# Patient Record
Sex: Male | Born: 1985 | Race: White | Hispanic: No | Marital: Married | Smoking: Never smoker
Health system: Southern US, Community
[De-identification: ages and names within clinical notes are randomized; demographics above are authoritative.]

## PROBLEM LIST (undated history)

## (undated) DIAGNOSIS — K219 Gastro-esophageal reflux disease without esophagitis: Secondary | ICD-10-CM

## (undated) DIAGNOSIS — M7150 Other bursitis, not elsewhere classified, unspecified site: Secondary | ICD-10-CM

## (undated) DIAGNOSIS — Q798 Other congenital malformations of musculoskeletal system: Secondary | ICD-10-CM

## (undated) DIAGNOSIS — G43909 Migraine, unspecified, not intractable, without status migrainosus: Secondary | ICD-10-CM

## (undated) HISTORY — DX: Other congenital malformations of musculoskeletal system: Q79.8

## (undated) HISTORY — DX: Migraine, unspecified, not intractable, without status migrainosus: G43.909

## (undated) HISTORY — DX: Gastro-esophageal reflux disease without esophagitis: K21.9

## (undated) HISTORY — DX: Other bursitis, not elsewhere classified, unspecified site: M71.50

---
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
## 2007-04-16 HISTORY — PX: SHOULDER SURGERY: SHX246

## 2008-04-15 HISTORY — PX: TOE SURGERY: SHX1073

## 2013-11-26 ENCOUNTER — Encounter: Payer: Self-pay | Admitting: *Deleted

## 2013-11-29 ENCOUNTER — Encounter: Payer: Self-pay | Admitting: Neurology

## 2013-11-29 ENCOUNTER — Ambulatory Visit (INDEPENDENT_AMBULATORY_CARE_PROVIDER_SITE_OTHER): Payer: PRIVATE HEALTH INSURANCE | Admitting: Neurology

## 2013-11-29 VITALS — BP 116/66 | HR 78 | Ht 73.0 in | Wt 172.0 lb

## 2013-11-29 DIAGNOSIS — G5622 Lesion of ulnar nerve, left upper limb: Secondary | ICD-10-CM

## 2013-11-29 DIAGNOSIS — G5620 Lesion of ulnar nerve, unspecified upper limb: Secondary | ICD-10-CM

## 2013-11-29 HISTORY — DX: Lesion of ulnar nerve, left upper limb: G56.22

## 2013-11-29 NOTE — Patient Instructions (Signed)
Overall you are doing fairly well but I do want to suggest a few things today:   Remember to drink plenty of fluid, eat healthy meals and do not skip any meals. Try to eat protein with a every meal and eat a healthy snack such as fruit or nuts in between meals. Try to keep a regular sleep-wake schedule and try to exercise daily, particularly in the form of walking, 20-30 minutes a day, if you can.   As far as your medications are concerned, I would like to suggest: continue to take OTC anti-inflammatory medications as needed per instructions  As far as diagnostic testing: please schedule an emg/ncs in one week  I would like to see you back in 1 week for an EMG/NCS, sooner if we need to. Please call us with any interim questions, concerns, problems, updates or refill requests.   Please also call us for any test results so we can go over those with you on the phone.  My clinical assistant and will answer any of your questions and relay your messages to me and also relay most of my messages to you.   Our phone number is [phone]. We also have an after hours call service for urgent matters and there is a physician on-call for urgent questions. For any emergencies you know to call 911 or go to the nearest emergency room

## 2013-11-29 NOTE — Progress Notes (Signed)
[facility]    Provider:  Dr Lucia Gaskins Referring Provider: Princella Pellegrini, PA Primary Care Physician:  No primary provider on file.  CC:  Finger numbness  HPI:  Brandon Maldonado is a 28 y.o. male here as a referral from Dr. Renato Gails for finger numbness  28 year old right handed male here for evaluation of left ring finger numbness since mid July, about a month. Has been taking anti-inflammatories which helps. The finger feels numb on the dorsal tip and ventral finger. Used to tingle and feel pressure but now more numb. Patient woke up with the symptoms. Denies any weakness in the hand or arm. But feels his motor skills are off. There is "plenty of strength". Not really painful but more numb and uncomfortable. Not worse at night. No nocturnal awakenings. No new neck or back pain (has some residual pain from previous shoulder surgery). His elbow bothers him if it is bent but doesn't seem to affect the finger. Not wearing an elbow brace at night. No radiating pain from the finger, elbow or from the neck. The numbness is present constantly, day and night. Takes anti-inflammatories as needed. No trauma to the elbow. No PMhx of diabetes or any other significant conditions. No other focal neurologic symptoms.    Reviewed notes from physician who noted patient described pins, needles, numbness in the left ring finger of moderate severity. Denied injury.   Review of Systems: Patient complains of symptoms per HPI as well as the following symptoms numbness. Pertinent negatives per HPI. Otherwise out of a complete 14 system review, and all other reviewed systems are negative.   History   Social History  . Marital Status: Married    Spouse Name: N/A    Number of Children: N/A  . Years of Education: N/A   Occupational History  . Not on file.   Social History Main Topics  . Smoking status: Never Smoker   . Smokeless tobacco: Never Used  . Alcohol Use: Yes     Comment: 1 if any    . Drug Use: No  . Sexual Activity: Yes   Other Topics Concern  . Not on file   Social History Narrative   Patient is married, with no children.   Patient is right handed.   Patient has a high school graduate, and has some college education.   Patient drinks 1 cup of coffee daily.    Family History  Problem Relation Age of Onset  . Vitamin D deficiency Mother   . Cancer - Lung Maternal Grandmother   . Leukemia Maternal Grandfather   . Lung cancer Paternal Grandmother     Past Medical History  Diagnosis Date  . Atopic dermatitis   . Esophageal reflux   . Dysfunction of eustachian tube   . Nausea with vomiting   . Pain in limb   . Migraine   . Other bursitis disorders     Past Surgical History  Procedure Laterality Date  . Shoulder surgery Right 2009    LABRAL TEAR  . Back surgery  2008  . Chest surgery  2008    moved muscle  . Shoulder surgery Left 2008  . Toe surgery Left 2010    4th toe    Current Outpatient Prescriptions  Medication Sig Dispense Refill  . Diclofenac Potassium (CAMBIA) 50 MG PACK Take 50 mg by mouth as needed.       . topiramate (TOPAMAX) 25 MG capsule Take 25 mg by mouth daily.  No current facility-administered medications for this visit.    Allergies as of 11/29/2013 - Review Complete 11/29/2013  Allergen Reaction Noted  . Amoxicillin Rash 11/26/2013    Vitals: BP 116/66  Pulse 78  Ht 6\' 1"  (1.854 m)  Wt 172 lb (78.019 kg)  BMI 22.70 kg/m2 Last Weight:  Wt Readings from Last 1 Encounters:  11/29/13 172 lb (78.019 kg)   Last Height:   Ht Readings from Last 1 Encounters:  11/29/13 6\' 1"  (1.854 m)     Physical exam: Exam: Gen: NAD, conversant Eyes: anicteric sclerae, moist conjunctivae HENT: Atraumatic, oropharynx clear Neck: Trachea midline; supple,  Lungs: CTA, no wheezing, rales, rhonic                          CV: RRR, no MRG Abdomen: Soft, non-tender;  Extremities: No peripheral edema  Skin: Normal  temperature, no rash,  Psych: Appropriate affect, pleasant  Neuro: Detailed Neurologic Exam  Speech:    Speech is normal; fluent and spontaneous with normal comprehension.   aphonia, hypophonic, dysarthria, nasal speech, and bulbar speech.    Cognition:    The patient is oriented to person, place, and time; memory intact; language fluent; normal attention, concentration, and fund of knowledge.   Cranial Nerves:    The pupils are equal, round, and reactive to light. The fundi are normal and spontaneous venous pulsations are present. Visual fields are full to finger confrontation. Extraocular movements are intact. Trigeminal sensation is intact and the muscles of mastication are normal. Mild left ptosis (chronic) otherwise the face is symmetric. The palate elevates in the midline. Voice is normal. Shoulder shrug is normal. The tongue has normal motion without fasciculations.   Coordination:    Normal finger to nose and heel to shin. Normal rapid alternating movements.   Gait:    Heel-toe and tandem gait are normal.   Motor Observation:    No asymmetry, no atrophy, and no involuntary movements noted.   Tone:    Normal muscle tone. reduced right leg and increased right arm.    Posture:    Posture is normal. normal erect   not quite erect, moderately stooped, severely stooped, extreme stooping, dystonic, hemiparetic, broad-based stance, neck flexed, and tilted.    Strength: Weakness of the intrinsic left hand ulnar muscles including ADM, FDI, Adductor Pollicis. Also weakness in FDP (3,4). No weakness of median intrinsic hand muscles or of the distal radial muscles. Otherwise strength is V/V in the upper and lower limbs.       Sesnory   Decreased pin prick in the volar medial digit 4 and proximal volar medial hand.    Reflex Exam:   DTR's:    Deep tendon reflexes in the upper and lower extremities are normal bilaterally.   Toes:    The toes are downgoing bilaterally.     Clonus:    Clonus is absent.      Assessment/plan: 28 year old male with 1 month of left digit 5 numbness. Exam significant for weakness of the left intrinsic ulnar hand muscles as well as the FDP(3,4) and sensory changes in an ulnar distribution. Tinel's sign at the elbow was negative although patient endorses elbow pain when bending. Otherwise neuro exam unremarkable.   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  Advised patient to avoid bending elbow and wear an elbow brace at night to avoid compression. Discussed other interventions including steroid injections, nerve transposition and refer to specialist. Will discuss further after EMG/NCS and symptom improvement with conservative measures.  OTC NSAIDs prn as needed  A total of 60 minutes was spent in with this patient. Over half this time was spent on counseling patient on the diagnosis and different therapeutic options available.   Naomie Dean, MD  [facility] [address] [place], [place] [address]  Phone [phone] Fax [phone]

## 2013-12-06 ENCOUNTER — Encounter (INDEPENDENT_AMBULATORY_CARE_PROVIDER_SITE_OTHER): Payer: Self-pay

## 2013-12-06 ENCOUNTER — Ambulatory Visit (INDEPENDENT_AMBULATORY_CARE_PROVIDER_SITE_OTHER): Payer: PRIVATE HEALTH INSURANCE | Admitting: Neurology

## 2013-12-06 DIAGNOSIS — G5622 Lesion of ulnar nerve, left upper limb: Secondary | ICD-10-CM

## 2013-12-06 DIAGNOSIS — M5010 Cervical disc disorder with radiculopathy, unspecified cervical region: Secondary | ICD-10-CM

## 2013-12-06 DIAGNOSIS — Z0289 Encounter for other administrative examinations: Secondary | ICD-10-CM

## 2013-12-06 DIAGNOSIS — G5620 Lesion of ulnar nerve, unspecified upper limb: Secondary | ICD-10-CM | POA: Diagnosis not present

## 2013-12-06 NOTE — Progress Notes (Signed)
  [facility]    Provider:  Dr Lucia Gaskins Referring Provider: No ref. provider found Primary Care Physician:  No primary provider on file.  CC:  Left hand weakness and paresthesias  History: 28 year old right handed male here for evaluation of left ring finger numbness since mid July, about a month. Has been taking anti-inflammatories which helps. The finger feels numb on the dorsal tip and ventral finger. Used to tingle and feel pressure but now more numb. Patient woke up with the symptoms. Denies any weakness in the hand or arm. But feels his motor skills are off. There is "plenty of strength". Not really painful but more numb and uncomfortable. Not worse at night. No nocturnal awakenings. Reports new as well as some residual pain from previous shoulder surgery. His elbow bothers him if it is bent but doesn't seem to affect the finger. Not wearing an elbow brace at night. No radiating pain from the finger, elbow or from the neck. The numbness is present constantly, day and night. Takes anti-inflammatories as needed. No trauma to the elbow. No PMhx of diabetes or any other significant conditions. No other focal neurologic symptoms. Focused exam demonstrates 4+/5 left triceps weakness, left mild APB, ADM, FDI, Adductor Pollicis and FDP (3,4) weakness. With decreased pin prick in the volar medial digit 4 and proximal volar medial hand.   Summary: All nerve conduction studies (as indicated in the following tables) were within normal limits.    All F Wave latencies were within normal limits.    Needle evaluation of the left triceps muscle showed increased spontaneous activity.  The left deltoid, left pronator teres, left flexor digitorum profundus(4,5), left extensor indicis, left first dorsal interosseous, left opponens pollicis muscle were all within normal limits. EMG needle exam of the left c8/c7/c6 paraspinal muscles was inconclusive due to persistent muscle activity.     Conclusion:  Isolated acute/ongoing denervation in a C6/C7/C8 proximal muscle (triceps) is of clinical uncertainty. However history of neck pain with left radicular symptoms and clinical weakness of muscles that share c8 innervation in addition to EMG findings could indicate cervical radiculopathy. May consider an MRI of the cervical spine as clinically warranted. No evidence of median or ulnar neuropathy or polyneuropathy.  Clinical correlation recommended.      Brandon Dean, MD  [facility] [address] [place], [place] [address]  Phone [phone] Fax [phone]

## 2013-12-09 ENCOUNTER — Ambulatory Visit (INDEPENDENT_AMBULATORY_CARE_PROVIDER_SITE_OTHER): Payer: PRIVATE HEALTH INSURANCE

## 2013-12-09 DIAGNOSIS — M5010 Cervical disc disorder with radiculopathy, unspecified cervical region: Secondary | ICD-10-CM

## 2013-12-09 DIAGNOSIS — M5412 Radiculopathy, cervical region: Secondary | ICD-10-CM

## 2013-12-21 ENCOUNTER — Telehealth: Payer: Self-pay | Admitting: Neurology

## 2013-12-21 NOTE — Telephone Encounter (Signed)
Patient call for results of his MRI cervical, said he missed the call on Friday.

## 2013-12-22 NOTE — Telephone Encounter (Signed)
Called and discussed with patient

## 2014-01-04 NOTE — Telephone Encounter (Signed)
Thanks, noted

## 2014-11-15 ENCOUNTER — Ambulatory Visit: Payer: Worker's Compensation

## 2014-11-15 ENCOUNTER — Ambulatory Visit (INDEPENDENT_AMBULATORY_CARE_PROVIDER_SITE_OTHER): Payer: Worker's Compensation | Admitting: Emergency Medicine

## 2014-11-15 VITALS — BP 130/86 | HR 66 | Temp 98.3°F | Resp 15 | Ht 73.0 in | Wt 180.0 lb

## 2014-11-15 DIAGNOSIS — M25551 Pain in right hip: Secondary | ICD-10-CM

## 2014-11-15 MED ORDER — NAPROXEN SODIUM 550 MG PO TABS: 550.0000 mg | ORAL_TABLET | Freq: Two times a day (BID) | ORAL | Status: DC
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
## 2014-11-15 NOTE — Patient Instructions (Signed)
Hip Pain Your hip is the joint between your upper legs and your lower pelvis. The bones, cartilage, tendons, and muscles of your hip joint perform a lot of work each day supporting your body weight and allowing you to move around. Hip pain can range from a minor ache to severe pain in one or both of your hips. Pain may be felt on the inside of the hip joint near the groin, or the outside near the buttocks and upper thigh. You may have swelling or stiffness as well.  HOME CARE INSTRUCTIONS   Take medicines only as directed by your health care provider.  Apply ice to the injured area:  Put ice in a plastic bag.  Place a towel between your skin and the bag.  Leave the ice on for 15-20 minutes at a time, 3-4 times a day.  Keep your leg raised (elevated) when possible to lessen swelling.  Avoid activities that cause pain.  Follow specific exercises as directed by your health care provider.  Sleep with a pillow between your legs on your most comfortable side.  Record how often you have hip pain, the location of the pain, and what it feels like. SEEK MEDICAL CARE IF:   You are unable to put weight on your leg.  Your hip is red or swollen or very tender to touch.  Your pain or swelling continues or worsens after 1 week.  You have increasing difficulty walking.  You have a fever. SEEK IMMEDIATE MEDICAL CARE IF:   You have fallen.  You have a sudden increase in pain and swelling in your hip. MAKE SURE YOU:   Understand these instructions.  Will watch your condition.  Will get help right away if you are not doing well or get worse. Document Released: 09/19/2009 Document Revised: 08/16/2013 Document Reviewed: 11/26/2012 Exit[employer] Patient Information 2015 [employer]. This information is not intended to replace advice given to you by your health care provider. Make sure you discuss any questions you have with your health care provider.  

## 2014-11-21 ENCOUNTER — Ambulatory Visit (INDEPENDENT_AMBULATORY_CARE_PROVIDER_SITE_OTHER): Payer: Worker's Compensation | Admitting: Emergency Medicine

## 2014-11-21 VITALS — BP 128/64 | HR 82 | Temp 98.7°F | Resp 16 | Ht 73.0 in | Wt 184.0 lb

## 2014-11-21 DIAGNOSIS — M25551 Pain in right hip: Secondary | ICD-10-CM

## 2014-11-21 NOTE — Progress Notes (Signed)
   Subjective:  Patient ID: Brandon Maldonado, male    DOB: [birth year]  Age: 29 y.o. MRN: [id number]  CC: Follow-up   HPI Brandon Maldonado presents  for reevaluation of his hip pain. He is ready to return to full duty has no pain and is able to participate in normal activities of daily living. He is off his medication  History Past medical family and social history are noncontributory and were negative  Review of Systems   Review of systems was noncontributory  Objective:  BP 128/64 mmHg  Pulse 82  Temp(Src) 98.7 F (37.1 C) (Oral)  Resp 16  Ht  (1.854 m)  Wt 184 lb (83.462 kg)  BMI 24.28 kg/m2  SpO2 97%  Physical Exam  Constitutional: He is oriented to person, place, and time. He appears well-developed and well-nourished. No distress.  HENT:  Head: Normocephalic and atraumatic.  Right Ear: External ear normal.  Left Ear: External ear normal.  Nose: Nose normal.  Eyes: Conjunctivae and EOM are normal. Pupils are equal, round, and reactive to light. No scleral icterus.  Neck: Normal range of motion. Neck supple. No tracheal deviation present.  Cardiovascular: Normal rate, regular rhythm and normal heart sounds.   Pulmonary/Chest: Effort normal. No respiratory distress. He has no wheezes. He has no rales.  Abdominal: He exhibits no mass. There is no tenderness. There is no rebound and no guarding.  Musculoskeletal: He exhibits no edema.  Lymphadenopathy:    He has no cervical adenopathy.  Neurological: He is alert and oriented to person, place, and time. Coordination normal.  Skin: Skin is warm and dry. No rash noted.  Psychiatric: He has a normal mood and affect. His behavior is normal.      Assessment & Plan:   Brandon Maldonado was seen today for follow-up.  Diagnoses and all orders for this visit:  Right hip pain   I have discontinued Mr. Greenlaw topiramate. I am also having him maintain his Diclofenac Potassium and naproxen sodium.  No orders of  the defined types were placed in this encounter.    Appropriate red flag conditions were discussed with the patient as well as actions that should be taken.  Patient expressed his understanding.  Follow-up: Return if symptoms worsen or fail to improve.  Carmelina Dane, MD

## 2014-11-21 NOTE — Patient Instructions (Signed)
Hip Pain Your hip is the joint between your upper legs and your lower pelvis. The bones, cartilage, tendons, and muscles of your hip joint perform a lot of work each day supporting your body weight and allowing you to move around. Hip pain can range from a minor ache to severe pain in one or both of your hips. Pain may be felt on the inside of the hip joint near the groin, or the outside near the buttocks and upper thigh. You may have swelling or stiffness as well.  HOME CARE INSTRUCTIONS   Take medicines only as directed by your health care provider.  Apply ice to the injured area:  Put ice in a plastic bag.  Place a towel between your skin and the bag.  Leave the ice on for 15-20 minutes at a time, 3-4 times a day.  Keep your leg raised (elevated) when possible to lessen swelling.  Avoid activities that cause pain.  Follow specific exercises as directed by your health care provider.  Sleep with a pillow between your legs on your most comfortable side.  Record how often you have hip pain, the location of the pain, and what it feels like. SEEK MEDICAL CARE IF:   You are unable to put weight on your leg.  Your hip is red or swollen or very tender to touch.  Your pain or swelling continues or worsens after 1 week.  You have increasing difficulty walking.  You have a fever. SEEK IMMEDIATE MEDICAL CARE IF:   You have fallen.  You have a sudden increase in pain and swelling in your hip. MAKE SURE YOU:   Understand these instructions.  Will watch your condition.  Will get help right away if you are not doing well or get worse. Document Released: 09/19/2009 Document Revised: 08/16/2013 Document Reviewed: 11/26/2012 Exit[employer] Patient Information 2015 [employer]. This information is not intended to replace advice given to you by your health care provider. Make sure you discuss any questions you have with your health care provider.  

## 2015-01-09 ENCOUNTER — Ambulatory Visit: Payer: Worker's Compensation

## 2015-01-09 ENCOUNTER — Ambulatory Visit (INDEPENDENT_AMBULATORY_CARE_PROVIDER_SITE_OTHER): Payer: Worker's Compensation | Admitting: Family Medicine

## 2015-01-09 VITALS — BP 126/62 | HR 70 | Temp 98.7°F | Resp 18 | Ht 74.0 in | Wt 183.0 lb

## 2015-01-09 DIAGNOSIS — S93402A Sprain of unspecified ligament of left ankle, initial encounter: Secondary | ICD-10-CM

## 2015-01-09 NOTE — Progress Notes (Addendum)
   This chart was scribed for Brandon Sidle, MD by Brandon Maldonado, medical scribe at [facility].The patient was seen in exam room 2 and the patient's care was started at 5:49 PM.  Patient ID: Brandon Maldonado MRN: [id number], DOB: [birth year], 29 y.o. Date of Encounter: 01/09/2015  Primary Physician: No PCP Per Patient  Chief Complaint:  Chief Complaint  Patient presents with  . Ankle Injury    today, left, WC    HPI:  Brandon Maldonado is a 29 y.o. male who presents to [facility] complaining of left ankle injury while at work today, around 3:15-3:30 PM. He fell and rolled his ankle. It's very sore to stand on it.   He works for [employer].   Allergies:  Allergies  Allergen Reactions  . Amoxicillin Rash    Review of Systems: Constitutional: negative for chills, fever, night sweats, weight changes, or fatigue  HEENT: negative for vision changes, hearing loss, congestion, rhinorrhea, ST, epistaxis, or sinus pressure Cardiovascular: negative for chest pain or palpitations Respiratory: negative for hemoptysis, wheezing, shortness of breath, or cough Abdominal: negative for abdominal pain, nausea, vomiting, diarrhea, or constipation Dermatological: negative for rash Neurologic: negative for headache, dizziness, or syncope Musc: positive for arthralgia (left ankle)  All other systems reviewed and are otherwise negative with the exception to those above and in the HPI.  Physical Exam: Blood pressure 126/62, pulse 70, temperature 98.7 F (37.1 C), resp. rate 18, height  (1.88 m), weight 183 lb (83.008 kg), SpO2 99 %., Body mass index is 23.49 kg/(m^2). General: Well developed, well nourished, in no acute distress. Head: Normocephalic, atraumatic, eyes without discharge, sclera non-icteric, nares are without discharge. Bilateral auditory canals clear, TM's are without perforation, pearly grey and translucent with  reflective cone of light bilaterally. Oral cavity moist, posterior pharynx without exudate, erythema, peritonsillar abscess, or post nasal drip.  Neck: Supple. No thyromegaly. Full ROM. No lymphadenopathy. Lungs: Clear bilaterally to auscultation without wheezes, rales, or rhonchi. Breathing is unlabored. Heart: RRR with S1 S2. No murmurs, rubs, or gallops appreciated. Abdomen: Soft, non-tender, non-distended with normoactive bowel sounds. No hepatomegaly. No rebound/guarding. No obvious abdominal masses. Msk:  Strength and tone normal for age. Extremities/Skin: Warm and dry. Swelling in left ankle without bony tenderness Neuro: Alert and oriented X 3. Moves all extremities spontaneously. Gait is normal. CNII-XII grossly in tact. Psych:  Responds to questions appropriately with a normal affect.   Labs: [facility] reading (PRIMARY) by Dr. Milus Glazier : left ankle x ray: normal left ankle   ASSESSMENT AND PLAN:  29 y.o. year old male with left ankle sprain This chart was scribed in my presence and reviewed by me personally.    ICD-9-CM ICD-10-CM   1. Ankle sprain, left, initial encounter 845.00 S93.402A DG Ankle Complete Left   Ibuprofen and splint  By signing my name below, I, Brandon Maldonado, attest that this documentation has been prepared under the direction and in the presence of Brandon Sidle, MD. Electronically Signed: Stann Maldonado, Scribe. 01/09/2015 , 5:49 PM .  Signed, Brandon Sidle, MD 01/09/2015 5:49 PM

## 2015-01-09 NOTE — Patient Instructions (Signed)
Use the ankle splint for the next week. Ibuprofen should help as well as icing tonight. I think you can go back to work tomorrow.

## 2015-02-09 ENCOUNTER — Encounter: Payer: Self-pay | Admitting: Neurology

## 2015-02-09 ENCOUNTER — Ambulatory Visit (INDEPENDENT_AMBULATORY_CARE_PROVIDER_SITE_OTHER): Payer: 59 | Admitting: Neurology

## 2015-02-09 VITALS — BP 143/87 | HR 77 | Ht 74.0 in | Wt 187.2 lb

## 2015-02-09 DIAGNOSIS — G243 Spasmodic torticollis: Secondary | ICD-10-CM

## 2015-02-09 MED ORDER — PREGABALIN 50 MG PO CAPS: 50.0000 mg | ORAL_CAPSULE | Freq: Three times a day (TID) | ORAL | Status: DC

## 2015-02-09 NOTE — Patient Instructions (Signed)
Remember to drink plenty of fluid, eat healthy meals and do not skip any meals. Try to eat protein with a every meal and eat a healthy snack such as fruit or nuts in between meals. Try to keep a regular sleep-wake schedule and try to exercise daily, particularly in the form of walking, 20-30 minutes a day, if you can.   As far as your medications are concerned, I would like to suggest: Lyrica 50mg  twice daily  As far as diagnostic testing:   I would like to see you back for botulinum therapy, sooner if we need to. Please call us with any interim questions, concerns, problems, updates or refill requests.   Our phone number is [phone]. We also have an after hours call service for urgent matters and there is a physician on-call for urgent questions. For any emergencies you know to call 911 or go to the nearest emergency room

## 2015-02-09 NOTE — Progress Notes (Signed)
[facility]    Provider:  Dr Lucia Gaskins Referring Provider: No ref. provider found Primary Care Physician:  No PCP Per Patient  CC:  <eft shoulder pain  HPI:  Brandon Maldonado is a 29 y.o. male here as a referral from Dr. No ref. 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   Interval update 02/09/2015: He is still having pain in the left shoulder and back. It is muscular pain. He had surgery in the past. The muscle pain is constant in the left shoulder. Worse after a long day or with use. 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 He has been to physical therapy, a lot of physical therapy and never saw any improvement. Most of the pain stems from the surgery, left paraspinal to the shoulder blade. The muscle remains tight constantly. He has tried stretching it out but it doesn't work. And it causes headaches. He tried muscle relaxers in the past. He has tried physical therapy. He has tried massage and topical anesthetics.   11/29/2013: 29 year old right handed male here for evaluation of left ring finger numbness since mid July, about a month. Has been taking anti-inflammatories which helps. The finger feels numb on the dorsal tip and ventral finger. Used to tingle and feel pressure but now more numb. Patient woke up with the symptoms. Denies any weakness in the hand or arm. But feels his motor skills are off. There is "plenty of strength". Not really painful but more numb and uncomfortable. Not worse at night. No nocturnal awakenings. Reports new as well as some residual pain from previous shoulder surgery. His elbow bothers him if it is bent but doesn't seem to affect the finger. Not wearing an elbow brace at night. No radiating pain from the finger, elbow or from the neck. The numbness is present constantly, day and night. Takes anti-inflammatories as needed. No trauma to the elbow. No PMhx of diabetes or any other  significant conditions. No other focal neurologic symptoms. Focused exam demonstrates 4+/5 left triceps weakness, left mild APB, ADM, FDI, Adductor Pollicis and FDP (3,4) weakness. With decreased pin prick in the volar medial digit 4 and proximal volar medial hand.   Reviewed notes, labs and imaging from outside physicians, which showed:  Summary: All nerve conduction studies (as indicated in the following tables) were within normal limits.   All F Wave latencies were within normal limits.   Needle evaluation of the left triceps muscle showed increased spontaneous activity. The left deltoid, left pronator teres, left flexor digitorum profundus(4,5), left extensor indicis, left first dorsal interosseous, left opponens pollicis muscle were all within normal limits. EMG needle exam of the left c8/c7/c6 paraspinal muscles was inconclusive due to persistent muscle activity.   FINDINGS: The cervical vertebrae demonstrate normal curvature and body height and marrow signal charecteristics. There are mild disc signal abnormalities at C 4-5 and C 5-6 but without frank disc herniation or cord compression or foraminal stenosis. There are mild facet hypertrophic changes C 4-5 and C 5-6. The spinal cord parenchyma shows normal signal charecteristics. The paraspinal soft tissue apperas unremarkable.  IMPRESSION: Slightly abnormal MRI cervical spine showing disc signal abnormalities at C 4-5 and C 5-6 but without significant compression  Review of Systems: Patient complains of symptoms per HPI as well as the following symptoms: back pain, aching muscles, neck pain, neck stiffness. headache. Pertinent negatives per HPI. All others negative.   Social  History   Social History  . Marital Status: Married    Spouse Name: N/A  . Number of Children: N/A  . Years of Education: N/A   Occupational History  . Not on file.   Social History Main Topics  . Smoking status: Never Smoker   . Smokeless tobacco:  Never Used  . Alcohol Use: 1.2 - 1.8 oz/week    2-3 Standard drinks or equivalent per week     Comment: 1 if any occasionally drinker  . Drug Use: No  . Sexual Activity: Yes   Other Topics Concern  . Not on file   Social History Narrative   Patient is married, with no children.   Patient is right handed.   Patient has a high school graduate, and has some college education.   Patient drinks 1 cup of coffee daily.    Family History  Problem Relation Age of Onset  . Vitamin D deficiency Mother   . Cancer - Lung Maternal Grandmother   . Cancer Maternal Grandmother   . Leukemia Maternal Grandfather   . Cancer Maternal Grandfather   . Hyperlipidemia Maternal Grandfather   . Lung cancer Paternal Grandmother   . Cancer Paternal Grandmother     Past Medical History  Diagnosis Date  . Atopic dermatitis   . Esophageal reflux   . Dysfunction of eustachian tube   . Nausea with vomiting   . Pain in limb   . Migraine   . Other bursitis disorders     Past Surgical History  Procedure Laterality Date  . Shoulder surgery Right 2009    LABRAL TEAR  . Back surgery  2008  . Chest surgery  2008    moved muscle  . Shoulder surgery Left 2008  . Toe surgery Left 2010    4th toe    Current Outpatient Prescriptions  Medication Sig Dispense Refill  . Diclofenac Potassium (CAMBIA) 50 MG PACK Take 50 mg by mouth as needed.      No current facility-administered medications for this visit.    Allergies as of 02/09/2015 - Review Complete 02/09/2015  Allergen Reaction Noted  . Amoxicillin Rash 11/26/2013    Vitals: Ht 6\' 2"  (1.88 m)  Wt 188 lb (85.276 kg)  BMI 24.13 kg/m2 Last Weight:  Wt Readings from Last 1 Encounters:  02/09/15 188 lb (85.276 kg)   Last Height:   Ht Readings from Last 1 Encounters:  02/09/15 6\' 2"  (1.88 m)   Physical exam: Exam: Gen: NAD, conversant, well nourised, obese, well groomed                     CV: RRR, no MRG. No Carotid Bruits. No peripheral  edema, warm, nontender Eyes: Conjunctivae clear without exudates or hemorrhage  Neuro: Detailed Neurologic Exam  Speech:    Speech is normal; fluent and spontaneous with normal comprehension.  Cognition:    The patient is oriented to person, place, and time;     recent and remote memory intact;     language fluent;     normal attention, concentration,     fund of knowledge Cranial Nerves:    The pupils are equal, round, and reactive to light. The fundi are normal and spontaneous venous pulsations are present. Visual fields are full to finger confrontation. Extraocular movements are intact. Trigeminal sensation is intact and the muscles of mastication are normal. The face is symmetric. The palate elevates in the midline. Hearing intact. Voice is normal. Shoulder shrug  is normal. The tongue has normal motion without fasciculations.   Coordination:    Normal finger to nose and heel to shin. Normal rapid alternating movements.   Gait:    Heel-toe and tandem gait are normal.   Motor Observation: Left shoulder elevated Left laterocollis Right torticollis Decreased ROM of left neck rotation  Tone:    Hypertrophy of the left Trapezius and levator Scapulae  Posture:    Posture is normal. normal erect    Strength:    Strength is V/V in the upper and lower limbs.        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 Refractory to Oral medications. Recommend Borulinum toxin to treat, goals increased range of motion, decreased pain ,in creased functionality.  reviewed w/ pt the procedure of botulinum toxin, incl side effects - localized weakness, inj site rxn, myalgia and spread from site of injection    Plan is for Dysport: Left Trapezius Left Levator Scapulae  Will Start Lyrica daily.  Naomie Dean, MD  [facility] [address] [place], [place] [address]  Phone [phone] Fax [phone]  A total of 30 minutes was  spent face-to-face with this patient. Over half this time was spent on counseling patient on the cervical dystonia/spasmodic torticollis diagnosis and different diagnostic and therapeutic options available.

## 2015-02-10 NOTE — Addendum Note (Signed)
Addended by: Carmelina DaneANDERSON, Vir Whetstine S on: 02/10/2015 03:07 PM   Modules accepted: Level of Service

## 2015-02-10 NOTE — Addendum Note (Signed)
Addended by: Carmelina DaneANDERSON, Jazlyn Tippens S on: 02/10/2015 03:09 PM   Modules accepted: Level of Service

## 2015-02-12 DIAGNOSIS — G243 Spasmodic torticollis: Secondary | ICD-10-CM | POA: Insufficient documentation

## 2015-02-12 HISTORY — DX: Spasmodic torticollis: G24.3

## 2015-02-13 ENCOUNTER — Telehealth: Payer: Self-pay | Admitting: Neurology

## 2015-02-13 NOTE — Telephone Encounter (Signed)
Pt called and states that pregabalin (LYRICA) 50 MG capsule is giving him several bad reactions and he has stopped taking, tightness in chest and throat made it harder to breath, lasted 6-8 hrs.  he also states that it has made headaches worse. Please call and advise [phone]

## 2015-02-13 NOTE — Telephone Encounter (Signed)
Tell patient he did the right thing stopping Lyrica. We will wait for the botox injections. Will discuss other medciations with him at that time. thanks

## 2015-02-14 NOTE — Telephone Encounter (Signed)
Called pt back. Told him per Dr. Lucia GaskinsAhern that it was good that he stopped the Lyrica. Told him not to take anymore. We will wait for botox injections and Dr. Lucia GaskinsAhern will discuss other medications at that time with him. He was wondering if he could get a medication to help when he has a headache. He previously used cambia, but he just used his last one. He said this has worked well for him and he had very little side effects.   Dr. Lucia GaskinsAhern stated she would be okay with calling in Rx for cambia.

## 2015-02-14 NOTE — Telephone Encounter (Signed)
Brandon Maldonado is fine thanks

## 2015-02-15 NOTE — Telephone Encounter (Signed)
Faxed enrollment form for cambia to [facility] at [phone]. Received fax confirmation.  Copy sent to medical records.  

## 2015-03-06 ENCOUNTER — Telehealth: Payer: Self-pay | Admitting: *Deleted

## 2015-03-06 NOTE — Telephone Encounter (Signed)
Faxed Dysport enrollment form to [facility] at [phone]. Received fax confirmation. Sent copy to medical records.

## 2015-03-06 NOTE — Telephone Encounter (Signed)
Made appt for pt on 12/5 at 830am. Offered earlier appt but pt out of town for thanksgiving through Monday. Offered another date. Could not do 12/6. Pt knows to check in 815am. Doing enrollment for Dysport. Dr Lucia Gaskinsahern will use samples for this appt.

## 2015-03-07 ENCOUNTER — Telehealth: Payer: Self-pay | Admitting: *Deleted

## 2015-03-07 NOTE — Telephone Encounter (Signed)
Tried calling pt to advise he needed to fill out patient authorization form online. Explained how he can do this. Told him to go to [email or web address]. Click on dysport tab and then go down to step two and click on patient authorization form. It will redirect him to fill out this form and he can submit it electronically. Gave [facility] phone number if he has further questions.

## 2015-03-07 NOTE — Telephone Encounter (Signed)
Called pt back. He wanted to know if the patient authorization form was the only thing he needed to fill out online. I advised that was the only one. He verbalized understanding.

## 2015-03-07 NOTE — Telephone Encounter (Addendum)
Pt called and says he filled out the form online and would like a call back, he has some additional questions. [phone]-[place]

## 2015-03-20 ENCOUNTER — Ambulatory Visit (INDEPENDENT_AMBULATORY_CARE_PROVIDER_SITE_OTHER): Payer: 59 | Admitting: Neurology

## 2015-03-20 VITALS — BP 152/81 | HR 76 | Temp 97.7°F | Ht 74.0 in | Wt 184.4 lb

## 2015-03-20 DIAGNOSIS — G243 Spasmodic torticollis: Secondary | ICD-10-CM | POA: Diagnosis not present

## 2015-03-20 NOTE — Progress Notes (Signed)
Dysport: 500unit/vial Lot: [id number] Expiration: 06/13/2015 NDC: [id number]  0.9% sodium chloride- 1mL total Lot: [id number] Expiration: 01/2016 NDC: [id number]

## 2015-03-20 NOTE — Progress Notes (Signed)
  [facility]    Provider:  Dr Lucia GaskinsAhern Referring Provider: No ref. provider found Primary Care Physician:  No PCP Per Patient  CC: Muscle pain  HPI: Brandon Maldonado is a 29 y.o. 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   Interval update 02/09/2015: He is still having pain in the left shoulder and back. It is muscular pain. He had surgery in the past. The muscle pain is constant in the left shoulder. Worse after a long day or with use. 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 He has been to physical therapy, a lot of physical therapy and never saw any improvement. Most of the pain stems from the surgery, left paraspinal to the shoulder blade. The muscle remains tight constantly. He has tried stretching it out but it doesn't work. And it causes headaches. He tried muscle relaxers in the past. He has tried physical therapy. He has tried massage and topical anesthetics.   11/29/2013: 29 year old right handed male here for evaluation of left ring finger numbness since mid July, about a month. Has been taking anti-inflammatories which helps. The finger feels numb on the dorsal tip and ventral finger. Used to tingle and feel pressure but now more numb. Patient woke up with the symptoms. Denies any weakness in the hand or arm. But feels his motor skills are off. There is "plenty of strength". Not really painful but more numb and uncomfortable. Not worse at night. No nocturnal awakenings. Reports new as well as some residual pain from previous shoulder surgery. His elbow bothers him if it is bent but doesn't seem to affect the finger. Not wearing an elbow brace at night. No radiating pain from the finger, elbow or from the neck. The numbness is present constantly, day and night. Takes anti-inflammatories as needed. No trauma to the elbow. No PMhx of diabetes or any other significant conditions. No other focal  neurologic symptoms. Focused exam demonstrates 4+/5 left triceps weakness, left mild APB, ADM, FDI, Adductor Pollicis and FDP (3,4) weakness. With decreased pin prick in the volar medial digit 4 and proximal volar medial hand.   Motor Observation: Left shoulder elevated Left laterocollis Right torticollis Decreased ROM of left neck rotation  Tone:  Hypertrophy of the left Trapezius and levator Scapulae  Assessment/Plan:   These are patient's first injections  reviewed w/ pt the procedure of botulinum toxin, incl side effects - localized weakness, inj site rxn, myalgia and spread from site of injection   Procedure note   EMG: EMG guidance was used to inject muscles detailed below. Aseptic procedure was performed and patient tolerated procedure. Procedure was performed by Dr. Azell DerA Ahern    Refractory to oral medications   Motrin / tylenol for injections site pain / soreness   REMS precautions handout given to patient   RTC - see instructions for details   500 units/1cc NS.   Units Injected 350 , Units wasted 150 , Units billed 0   j code [id number] Dysport: 500unit/vial Lot: [id number] Expiration: 06/13/2015 NDC: [id number]  0.9% sodium chloride- 1mL total Lot: [id number] Expiration: 01/2016 NDC: [id number]  Left Levator Scapulae 100units in one site Left Trapezius 250 units in 2 locations   Naomie DeanAntonia Ahern, MD  [facility]  [address]  [place] [id number]  Phone [phone] Fax [phone]

## 2015-04-05 ENCOUNTER — Telehealth: Payer: Self-pay | Admitting: Neurology

## 2015-04-05 DIAGNOSIS — G243 Spasmodic torticollis: Secondary | ICD-10-CM

## 2015-04-05 DIAGNOSIS — M542 Cervicalgia: Secondary | ICD-10-CM

## 2015-04-05 DIAGNOSIS — M25519 Pain in unspecified shoulder: Secondary | ICD-10-CM

## 2015-04-05 NOTE — Telephone Encounter (Signed)
Pt called and would like to speak with the nurse or physician about his medication. States that it is making everything worse. States he is in more pain. Please call and advise [phone]

## 2015-04-05 NOTE — Telephone Encounter (Signed)
Tell him if he is experiencing weakness it is temporary,it wears off in 8-12 weeks. Let Duwayne HeckDanielle know not to get him approved for dysport. Place him a referral to orthopaedic surgery pleaseThanks.

## 2015-04-05 NOTE — Telephone Encounter (Addendum)
Called pt. He stated dysport injection did not help and thinks it made sx worse. Has weakness and thinks it is worse. Advised this weakness will wear off. May take some time. He wants to go ahead to see an orthopedic doctor. He will not be able to schedule for a couple months, but would like referral placed still. Told him I will make Dr Lucia GaskinsAhern aware. He is not taking Lyrica because he had bad SE from medication.

## 2015-04-05 NOTE — Telephone Encounter (Signed)
Irish Lack[facility]isha with [facility] [phone] inquiring about prior auth and benefit verification.

## 2015-04-06 NOTE — Telephone Encounter (Signed)
Spoke with Kara MeadEmma RN and she stated that the patient has decided to no longer receive injections.

## 2015-05-01 ENCOUNTER — Ambulatory Visit: Payer: Self-pay | Admitting: Neurology

## 2015-05-03 ENCOUNTER — Telehealth: Payer: Self-pay | Admitting: Neurology

## 2015-05-03 NOTE — Telephone Encounter (Signed)
[facility] called back regarding Dysport for patient. Irish Lack advised earlier message was routed to [place].

## 2015-05-03 NOTE — Telephone Encounter (Signed)
Irish Lack with [employer] is calling regarding prior authorization for Dysport in order for the patient to obtain co-pay assistance. Please call and advise. Thank you.

## 2015-05-03 NOTE — Telephone Encounter (Signed)
Patient is no longer receiving treatment.

## 2017-09-02 ENCOUNTER — Ambulatory Visit (INDEPENDENT_AMBULATORY_CARE_PROVIDER_SITE_OTHER): Payer: 59 | Admitting: Family Medicine

## 2017-09-02 ENCOUNTER — Encounter: Payer: Self-pay | Admitting: Family Medicine

## 2017-09-02 VITALS — BP 120/82 | HR 64 | Temp 97.7°F | Resp 12 | Ht 73.5 in | Wt 187.0 lb

## 2017-09-02 DIAGNOSIS — R059 Cough, unspecified: Secondary | ICD-10-CM

## 2017-09-02 DIAGNOSIS — M7632 Iliotibial band syndrome, left leg: Secondary | ICD-10-CM

## 2017-09-02 DIAGNOSIS — G43809 Other migraine, not intractable, without status migrainosus: Secondary | ICD-10-CM | POA: Diagnosis not present

## 2017-09-02 DIAGNOSIS — R05 Cough: Secondary | ICD-10-CM

## 2017-09-02 DIAGNOSIS — K219 Gastro-esophageal reflux disease without esophagitis: Secondary | ICD-10-CM | POA: Insufficient documentation

## 2017-09-02 DIAGNOSIS — G43909 Migraine, unspecified, not intractable, without status migrainosus: Secondary | ICD-10-CM | POA: Insufficient documentation

## 2017-09-02 MED ORDER — BENZONATATE 200 MG PO CAPS: 200.0000 mg | ORAL_CAPSULE | Freq: Two times a day (BID) | ORAL | 0 refills | Status: DC | PRN

## 2017-09-02 MED ORDER — IPRATROPIUM BROMIDE 0.06 % NA SOLN: 2.0000 | Freq: Four times a day (QID) | NASAL | 0 refills | Status: DC

## 2017-09-02 MED ORDER — DICLOFENAC SODIUM 75 MG PO TBEC
75.0000 mg | DELAYED_RELEASE_TABLET | Freq: Two times a day (BID) | ORAL | 0 refills | Status: DC
Start: 2017-09-02 — End: 2018-03-27

## 2017-09-02 NOTE — Assessment & Plan Note (Signed)
Stable without meds. 

## 2017-09-02 NOTE — Progress Notes (Signed)
Subjective:  Brandon Maldonado is a 32 y.o. male who presents today with a chief complaint of cough and to establish care.   HPI:  Cough, New problem Started 2 weeks ago.  Stable over that time.  Worse at night and interferes with his sleep.  Cough comes and goes.  No fevers or chills.  Associated with sputum production and rhinorrhea.  Has tried over-the-counter cough medication which is helped a little bit.  No other obvious alleviating or aggravating factors.  Hip Pain, New problem Started a few weeks ago.  Located on the posterior lateral aspect of his left hip.  No obvious precipitating events, but did note that he had a fall to 3 years ago which may have precipitated it.  Pain is worse with less physical activity.  He also notes that he lays and sleeps more on the left side which could be contributing.  No specific treatments tried.  Headaches/migraines, chronic problem Several year history. Has migraine once or twice monthly. Symptoms are stable.   ROS: Positive for congestion, chest tightness, cough, chest pain, joint pain, muscle aches, and headaches, otherwise a complete review of systems was negative.   PMH:  The following were reviewed and entered/updated in epic: Past Medical History:  Diagnosis Date  . Esophageal reflux   . Migraine   . Other bursitis disorders    Patient Active Problem List   Diagnosis Date Noted  . Migraine   . Esophageal reflux   . 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 BACK SURGERY  2008  . CHEST SURGERY  2008   moved muscle  . SHOULDER SURGERY Right 2009   LABRAL TEAR  . SHOULDER SURGERY Left 2008  . TOE SURGERY Left 2010   4th toe, removed cyst    Family History  Problem Relation Age of Onset  . Vitamin D deficiency Mother   . Cancer - Lung Maternal Grandmother   . Cancer Maternal Grandmother        Breast  .  Leukemia Maternal Grandfather   . Cancer Maternal Grandfather   . Hyperlipidemia Maternal Grandfather   . Lung cancer Paternal Grandmother   . Cancer Paternal Grandmother     Medications- reviewed and updated Current Outpatient Medications  Medication Sig Dispense Refill  . benzonatate (TESSALON) 200 MG capsule Take 1 capsule (200 mg total) by mouth 2 (two) times daily as needed for cough. 20 capsule 0  . diclofenac (VOLTAREN) 75 MG EC tablet Take 1 tablet (75 mg total) by mouth 2 (two) times daily. 30 tablet 0  . ipratropium (ATROVENT) 0.06 % nasal spray Place 2 sprays into both nostrils 4 (four) times daily. 15 mL 0   No current facility-administered medications for this visit.     Allergies-reviewed and updated Allergies  Allergen Reactions  . Amoxicillin Rash    Social History   Socioeconomic History  . Marital status: Married    Spouse name: Not on file  . Number of children: 0  . Years of education: Not on file  . Highest education level: Not on file  Occupational History  . Not on file  Social Needs  . Financial resource strain: Not on file  . Food insecurity:    Worry: Not on file    Inability: Not on file  . Transportation needs:    Medical: Not on file    Non-medical:  Not on file  Tobacco Use  . Smoking status: Never Smoker  . Smokeless tobacco: Never Used  Substance and Sexual Activity  . Alcohol use: Yes    Alcohol/week: 1.2 - 1.8 oz    Types: 2 - 3 Standard drinks or equivalent per week    Comment: 1 if any occasionally drinker  . Drug use: No  . Sexual activity: Yes  Lifestyle  . Physical activity:    Days per week: Not on file    Minutes per session: Not on file  . Stress: Not on file  Relationships  . Social connections:    Talks on phone: Not on file    Gets together: Not on file    Attends religious service: Not on file    Active member of club or organization: Not on file    Attends meetings of clubs or organizations: Not on file     Relationship status: Not on file  Other Topics Concern  . Not on file  Social History Narrative   Patient is married, with no children.   Patient is right handed.   Patient has a high school graduate, and has some college education.   Patient drinks 1 cup of coffee daily.     Objective:  Physical Exam: BP 120/82   Pulse 64   Temp 97.7 F (36.5 C) (Oral)   Resp 12   Ht 6' 1.5" (1.867 m)   Wt 187 lb (84.8 kg)   SpO2 97%   BMI 24.34 kg/m   Gen: NAD, resting comfortably HEENT: TMs with clear effusion bilaterally. Nasal mucosa erythematous and boggy bilaterally with clear discharge. OP clear b/l. CV: RRR with no murmurs appreciated Pulm: NWOB, CTAB with no crackles, wheezes, or rhonchi GI: Normal bowel sounds present. Soft, Nontender, Nondistended. MSK:  -Left hip: No deformities. Tender to palpation along posterior aspect of left hip. Ober test positive. Strength 5/5 throughout, though has some pain with resisted abduction. -Right hip: No deformities. Nontender to palpation. Strength 5/5 throughout.  Skin: Warm, dry Neuro: Grossly normal, moves all extremities Psych: Normal affect and thought content  Assessment/Plan:  Cough Likely secondary to post-nasal drip vs allergic rhinitis. Start atrovent nasal spray. Start tessalon. No signs of bacterial infection - no indication for abx today. Return precautions reviewed. Follow up as needed.  Left Hip Pain Pt with tight IT band and tenderness on hip abductors. Will treat with course of diclofenac. Discussed home exercise program. Return precautions reviewed. Follow up as needed.   Katina Degree. Jimmey Ralph, MD 09/02/2017 10:51 AM

## 2017-09-02 NOTE — Patient Instructions (Signed)
It was nice to see you today!  You have upper airway inflammation. I think this is the source of your cough. Please start the nasal spray and the cough medication.  Let me know if your symptoms do not gradually improve over the next several weeks.  I also think your have IT band syndrome. Please start the anti-inflammatory and work on the exercises.  I would like to see you back soon for your head to toe physical.   Take care, Dr Jimmey Ralph

## 2017-09-02 NOTE — Assessment & Plan Note (Signed)
Stable.  Continue OTC analgesics as needed. 

## 2017-10-26 ENCOUNTER — Encounter (HOSPITAL_COMMUNITY): Payer: Self-pay | Admitting: Emergency Medicine

## 2017-10-26 ENCOUNTER — Other Ambulatory Visit: Payer: Self-pay

## 2017-10-26 ENCOUNTER — Emergency Department (HOSPITAL_COMMUNITY)
Admission: EM | Admit: 2017-10-26 | Discharge: 2017-10-26 | Disposition: A | Discharge status: Home / Self Care | Payer: 59 | Attending: Emergency Medicine | Admitting: Emergency Medicine

## 2017-10-26 DIAGNOSIS — Z79899 Other long term (current) drug therapy: Secondary | ICD-10-CM | POA: Diagnosis not present

## 2017-10-26 DIAGNOSIS — Y998 Other external cause status: Secondary | ICD-10-CM | POA: Diagnosis not present

## 2017-10-26 DIAGNOSIS — Y93H9 Activity, other involving exterior property and land maintenance, building and construction: Secondary | ICD-10-CM | POA: Diagnosis not present

## 2017-10-26 DIAGNOSIS — Z23 Encounter for immunization: Secondary | ICD-10-CM | POA: Insufficient documentation

## 2017-10-26 DIAGNOSIS — W270XXA Contact with workbench tool, initial encounter: Secondary | ICD-10-CM | POA: Insufficient documentation

## 2017-10-26 DIAGNOSIS — S61412A Laceration without foreign body of left hand, initial encounter: Secondary | ICD-10-CM | POA: Diagnosis present

## 2017-10-26 DIAGNOSIS — Y929 Unspecified place or not applicable: Secondary | ICD-10-CM | POA: Diagnosis not present

## 2017-10-26 MED ORDER — TETANUS-DIPHTH-ACELL PERTUSSIS 5-2.5-18.5 LF-MCG/0.5 IM SUSP
0.5000 mL | Freq: Once | INTRAMUSCULAR | Status: AC
  Administered 2017-10-26: 0.5 mL via INTRAMUSCULAR
  Filled 2017-10-26: qty 0.5

## 2017-10-26 NOTE — ED Notes (Signed)
PA at bedside.  Pt in FT room.  See PA assessment.

## 2017-10-26 NOTE — Discharge Instructions (Addendum)
Continue to keep your hand clean, wear a glove cover if soaking.  Tissue adhesive will fall off on its own.  Follow-up as needed.  Watch for any signs of infection.

## 2017-10-26 NOTE — ED Triage Notes (Signed)
Pt reports he cut L hand with an axe while cutting. Denies pain. Reports he is ovedue for TDAP.

## 2017-10-26 NOTE — ED Provider Notes (Signed)
[facility] EMERGENCY DEPARTMENT Provider Note   CSN: [id number] Arrival date & time: 10/26/17  2123     History   Chief Complaint Chief Complaint  Patient presents with  . Extremity Laceration    HPI Brandon Maldonado is a 32 y.o. male.  HPI Brandon Maldonado is a 32 y.o. male presents to emergency department complaining of laceration to the left hand.  Patient states he was chopping wood and cut his dorsal left hand with an ax.  States that he applied pressure to stop the bleeding.  Denies any numbness or weakness distal to the laceration.  No difficulty moving his fingers or hand.  Tetanus is unknown.  Past Medical History:  Diagnosis Date  . Esophageal reflux   . Migraine   . Other bursitis disorders     Patient Active Problem List   Diagnosis Date Noted  . Migraine   . Esophageal reflux   . 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 BACK SURGERY  2008  . CHEST SURGERY  2008   moved muscle  . SHOULDER SURGERY Right 2009   LABRAL TEAR  . SHOULDER SURGERY Left 2008  . TOE SURGERY Left 2010   4th toe, removed cyst        Home Medications    Prior to Admission medications   Medication Sig Start Date End Date Taking? Authorizing Provider  benzonatate (TESSALON) 200 MG capsule Take 1 capsule (200 mg total) by mouth 2 (two) times daily as needed for cough. 09/02/17   Ardith Dark, MD  diclofenac (VOLTAREN) 75 MG EC tablet Take 1 tablet (75 mg total) by mouth 2 (two) times daily. 09/02/17   Ardith Dark, MD  ipratropium (ATROVENT) 0.06 % nasal spray Place 2 sprays into both nostrils 4 (four) times daily. 09/02/17   Ardith Dark, MD    Family History Family History  Problem Relation Age of Onset  . Vitamin D deficiency Mother   . Cancer - Lung Maternal Grandmother   . Cancer Maternal Grandmother        Breast  .  Leukemia Maternal Grandfather   . Cancer Maternal Grandfather   . Hyperlipidemia Maternal Grandfather   . Lung cancer Paternal Grandmother   . Cancer Paternal Grandmother     Social History Social History   Tobacco Use  . Smoking status: Never Smoker  . Smokeless tobacco: Never Used  Substance Use Topics  . Alcohol use: Yes    Alcohol/week: 1.2 - 1.8 oz    Types: 2 - 3 Standard drinks or equivalent per week    Comment: 1 if any occasionally drinker  . Drug use: No     Allergies   Amoxicillin   Review of Systems Review of Systems  Constitutional: Negative for chills and fever.  Respiratory: Negative for cough.   Musculoskeletal: Positive for arthralgias.  Skin: Positive for wound. Negative for rash.  Allergic/Immunologic: Negative for immunocompromised state.  Neurological: Negative for weakness and numbness.  All other systems reviewed and are negative.    Physical Exam Updated Vital Signs BP (!) 142/86   Pulse 86   Temp 98.1 F (36.7 C) (Oral)   Resp 16   Ht 6\' 2"  (1.88 m)   Wt 83.9 kg (185 lb)   SpO2 99%   BMI 23.75 kg/m   Physical Exam  Constitutional: He appears well-developed  and well-nourished. No distress.  Eyes: Conjunctivae are normal.  Neck: Neck supple.  Cardiovascular: Normal rate.  Pulmonary/Chest: No respiratory distress.  Abdominal: He exhibits no distension.  Musculoskeletal:  Full range of motion of all fingers of the left hand, specifically full range of motion and strength of the left index finger with extension and flexion in each joint.  Sensation is intact distally in all dermatomes.  Capillary refill less than 2 seconds distally  Skin: Skin is warm and dry.  2 cm superficial flap laceration to the dorsal left hand, just proximal to the second MCP joint.  Hemostatic.  Nursing note and vitals reviewed.    ED Treatments / Results  Labs (all labs ordered are listed, but only abnormal results are displayed) Labs Reviewed - No data  to display  EKG None  Radiology No results found.  Procedures .Marland Kitchen.Laceration Repair Date/Time: 10/26/2017 9:58 PM Performed by: Jaynie CrumbleKirichenko, Takiyah Bohnsack, PA-C Authorized by: Jaynie CrumbleKirichenko, Taryn Nave, PA-C   Consent:    Consent obtained:  Verbal   Consent given by:  Patient   Risks discussed:  Pain, infection and need for additional repair Laceration details:    Location:  Hand   Hand location:  L hand, dorsum   Length (cm):  3 Repair type:    Repair type:  Simple Pre-procedure details:    Preparation:  Patient was prepped and draped in usual sterile fashion Exploration:    Wound exploration: wound explored through full range of motion     Wound extent: no muscle damage noted, no nerve damage noted, no tendon damage noted, no underlying fracture noted and no vascular damage noted   Treatment:    Area cleansed with:  Shur-Clens   Amount of cleaning:  Standard Skin repair:    Repair method:  Tissue adhesive Approximation:    Approximation:  Close Post-procedure details:    Dressing:  Open (no dressing)   Patient tolerance of procedure:  Tolerated well, no immediate complications   (including critical care time)  Medications Ordered in ED Medications - No data to display   Initial Impression / Assessment and Plan / ED Course  I have reviewed the triage vital signs and the nursing notes.  Pertinent labs & imaging results that were available during my care of the patient were reviewed by me and considered in my medical decision making (see chart for details).     Pt with superficial flap laceration, repaired with dermabond. tdap updated. Home with wound care and follow up as needed.   Vitals:   10/26/17 2129 10/26/17 2131  BP: (!) 142/86   Pulse: 86   Resp: 16   Temp: 98.1 F (36.7 C)   TempSrc: Oral   SpO2: 99%   Weight:  83.9 kg (185 lb)  Height:  6\' 2"  (1.88 m)    Final Clinical Impressions(s) / ED Diagnoses   Final diagnoses:  Laceration of left hand, foreign  body presence unspecified, initial encounter    ED Discharge Orders    None       Jaynie CrumbleKirichenko, Yaeli Hartung, PA-C 10/26/17 2200    Loren RacerYelverton, David, MD 10/26/17 2318

## 2018-03-27 ENCOUNTER — Ambulatory Visit (INDEPENDENT_AMBULATORY_CARE_PROVIDER_SITE_OTHER): Payer: 59

## 2018-03-27 ENCOUNTER — Other Ambulatory Visit: Payer: Self-pay

## 2018-03-27 ENCOUNTER — Ambulatory Visit (INDEPENDENT_AMBULATORY_CARE_PROVIDER_SITE_OTHER): Payer: 59 | Admitting: Family Medicine

## 2018-03-27 ENCOUNTER — Telehealth: Payer: Self-pay | Admitting: Family Medicine

## 2018-03-27 ENCOUNTER — Encounter: Payer: Self-pay | Admitting: Family Medicine

## 2018-03-27 VITALS — BP 122/74 | HR 77 | Temp 98.6°F | Ht 74.0 in | Wt 197.8 lb

## 2018-03-27 DIAGNOSIS — M7742 Metatarsalgia, left foot: Secondary | ICD-10-CM | POA: Diagnosis not present

## 2018-03-27 DIAGNOSIS — M79672 Pain in left foot: Secondary | ICD-10-CM | POA: Diagnosis not present

## 2018-03-27 DIAGNOSIS — Z23 Encounter for immunization: Secondary | ICD-10-CM | POA: Diagnosis not present

## 2018-03-27 DIAGNOSIS — G5752 Tarsal tunnel syndrome, left lower limb: Secondary | ICD-10-CM | POA: Diagnosis not present

## 2018-03-27 MED ORDER — DICLOFENAC SODIUM 75 MG PO TBEC: 75.0000 mg | DELAYED_RELEASE_TABLET | Freq: Two times a day (BID) | ORAL | 0 refills | Status: DC

## 2018-03-27 NOTE — Progress Notes (Signed)
   Subjective:  Brandon Maldonado is a 32 y.o. male who presents today for same-day appointment with a chief complaint of foot pain.   HPI:  Foot Pain, Acute problem Started several weeks ago.  Pain mostly located to his left great toe.  His foot was run over by a "small" piece equipment at work.  Did not immediately notice any pain however the next day started having pain to the area.  Pain is worse with walking.  Has tried Tylenol Motrin with modest improvement.  He has had some numbness along the bottom of his foot that started a few days ago.  No swelling.  No bruising.  No other treatments tried.  No other obvious alleviating or aggravating factors.  ROS: Per HPI  PMH: He reports that he has never smoked. He has never used smokeless tobacco. He reports current alcohol use of about 2.0 - 3.0 standard drinks of alcohol per week. He reports that he does not use drugs.  Objective:  Physical Exam: BP 122/74 (BP Location: Left Arm, Patient Position: Sitting, Cuff Size: Normal)   Pulse 77   Temp 98.6 F (37 C) (Oral)   Ht 6\' 2"  (1.88 m)   Wt 197 lb 12.8 oz (89.7 kg)   SpO2 97%   BMI 25.40 kg/m   Gen: NAD, resting comfortably MSK: -Left foot: No deformities.  First MTP mildly tender to palpation.  Full range of motion.  No pain with axial loading.  No palpable click with lateral squeeze test.  Tinel sign positive at medial malleolus.  Neurovascular intact distally.  Assessment/Plan:  Metatarsalgia Tamsen Roers/tarsal tunnel syndrome No obvious fractures based on my read as plain film.  Will await radiology read.  He is very tender over the first metatarsal head.  Given his recent trauma, will place him in a rigid sole shoe for the next couple weeks.  Also start diclofenac 75 mg twice daily for the next 1 to 2 weeks, then as needed.  The numbness in his foot is most likely secondary to mild tarsal tunnel syndrome, most likely secondary to altered gait mechanics due to his metatarsalgia.  This will  hopefully improve as we treat his metatarsal pain.  Discussed reasons to return to care.  Follow-up as needed.  Consider referral to sports medicine if no improvement in the next 2 weeks.  Katina Degreealeb M. Jimmey RalphParker, MD 03/27/2018 11:05 AM

## 2018-03-27 NOTE — Telephone Encounter (Signed)
Copied from CRM [phone]#[id number]. Topic: Quick Communication - See Telephone Encounter >> Mar 27, 2018  4:30 PM Terisa Starraylor, Brittany L wrote: CRM for notification. See Telephone encounter for: 03/27/18.  Patient states he was in the office today and Dr Parked advised him that he was going to call him in " diclofenac 75 mg twice daily for the next 1 to 2 weeks, then as needed ". Patient checked with pharmacy and not prescription is there. Please Advise.  [id number]- [place], [place] - [address] [facility] [address] [place] [phone]

## 2018-03-27 NOTE — Progress Notes (Signed)
Please inform patient of the following:  Radiologist reviewed his xray and did not see any fractures. He has a small amount of normal wear and tear. Do not need to make any changes to his treatment plan at this time. Would like for him to let us know if his symptoms do not improve in the next couple of weeks.  Katina Degreealeb M. Jimmey RalphParker, MD 03/27/2018 5:50 PM

## 2018-03-27 NOTE — Patient Instructions (Addendum)
It was very nice to see you today!  You have an area of inflammation in the bone in your foot called the metatarsal.  This is a common area to have foot pain.  I do not see any fractures on your x-ray, however the radiologist will review this and will let you know if we need to do anything else.  Please use the boot for the next couple of weeks.  Please take the diclofenac 75 mg twice daily for the next 1 to 2 weeks as well.  If your symptoms do not improve the next several weeks, or if they worsen please let me know and we can get you into see our sports medicine doctor.  Take care, Dr Jimmey Ralph   Tarsal Tunnel Syndrome Tarsal tunnel syndrome is a condition that happens when a nerve (tibial nerve) is irritated or squeezed (compressed) as it passes through an area on the inside of your ankle (tarsal tunnel). The tarsal tunnel is a narrow passage through the connective tissue and bones in your feet (tarsals). The tibial nerve passes behind the large bony bump at your inner ankle (medial malleolus) and sends branches to your foot and toes. This nerve helps supply feeling to your heel, to the bottom of your foot, and to some of your toe muscles. Tarsal tunnel syndrome usually causes ankle and foot pain that gets worse with activity. What are the causes? Tarsal tunnel syndrome can be caused by any condition that narrows the space in the tarsal tunnel. Athletes may get tarsal tunnel syndrome from a fractured ankle or from an outward (eversion) ankle sprain that results in scarring or swelling. Other common causes include:  Over-pronation. This is when your feet roll in and flatten too much when you stand, walk, or run.  Extra pressure on the tarsal tunnel area from tight or stiff shoes or boots.  Decreased room in the tarsal tunnel due to small, fluid-filled sacs (cysts) or growths on the bones near the tunnel (exostosis).  What increases the risk? This condition is more likely to develop in people  who:  Play sports where they wear high, stiff boots, such as downhill skiing.  Play sports with repetitive motion, such as running.  Play sports on uneven surfaces that can lead to a sprained ankle, such as soccer or football.  Have had an inner ankle injury.  Have flat feet.  Have a condition such as diabetes, hypothyroidism, or rheumatoid arthritis.  What are the signs or symptoms? Symptoms of this condition include:  Burning pain behind the ankle, in the heel, or in the foot that gets worse if you are standing, walking, or running.  Numbness or a tingling sensation (pins and needles) in your heel, foot, or toes.  At first, your symptoms may get worse with activity and be relieved with rest. Over time, your symptoms may become constant or come on sooner with less activity. How is this diagnosed? This condition is diagnosed based on your symptoms, medical history, and physical exam. During the exam, your health care provider may tap on the area below your ankle to check for tingling in your foot or toes. Your health care provider may also inject numbing medicine into the tarsal tunnel to see if it relieves your pain. You may also have other tests, including:  X-rays to check bone structure.  MRI or ultrasound to examine nerve and tendon structures and find where your nerve is getting compressed.  An electrical study of nerve function (electromyography, or  EMG).  How is this treated? Treatment may include:  Wearing a removable splint or boot for ankle support.  Using a shoe insert (orthotic) to help support your arch.  Using ice to reduce swelling.  Taking pain medicine.  Having medicine injected into your ankle joint to reduce pain and swelling.  Starting range-of-motion exercises and strengthening exercises.  Gradually returning to full activity. The timing will depend on the severity of your condition and your response to treatment.  You may need surgery if there is  a soft tissue growth or a bone growth, or if other treatments have not helped. After surgery, you may need to wear a removable splint or boot for support. You also may need physical therapy. Follow these instructions at home: If you have a splint or boot:  Wear the splint or boot as told by your health care provider. Remove it only as told by your health care provider.  Loosen the splint or boot if your toes tingle, become numb, or turn cold and blue.  If your splint or boot is not waterproof: ? Do not let it get wet. ? Cover it with a watertight covering when you take a bath or shower.  Keep the splint or boot clean. Managing pain, stiffness, and swelling  If directed, put ice on the injured area. ? Put ice in a plastic bag. ? Place a towel between your skin and the bag. ? Leave the ice on for 20 minutes, 2-3 times a day.  Move your toes often to avoid stiffness and to lessen swelling.  Raise (elevate) your foot above the level of your heart while you are sitting or lying down. Driving  Ask your health care provider when it is safe to drive if you have a splint or boot on a foot that you use for driving.  Do not drive or operate heavy machinery while taking prescription pain medicine. Activity  Return to your normal activities as told by your health care provider. Ask your health care provider what activities are safe for you.  Do exercises as told by your health care provider. General instructions  Do not use any tobacco products, such as cigarettes, chewing tobacco, and e-cigarettes. Tobacco can delay healing. If you need help quitting, ask your health care provider.  Take over-the-counter and prescription medicines only as told by your health care provider.  Keep all follow-up visits as told by your health care provider. This is important. How is this prevented?  Give your body time to rest between periods of activity.  Make sure to wear supportive and comfortable  shoes during athletic activity.  Do not over-tighten ski boots or the laces on high-top shoes.  Be safe and responsible while being active to avoid falls. Contact a health care provider if:  Your ankle pain is not getting better.  You are unable to support (bear) body weight on your foot without pain. This information is not intended to replace advice given to you by your health care provider. Make sure you discuss any questions you have with your health care provider. Document Released: 04/01/2005 Document Revised: 12/06/2015 Document Reviewed: 03/14/2015 Elsevier Interactive Patient Education  [employer]2018 [employer]

## 2018-03-27 NOTE — Telephone Encounter (Signed)
Rx sent to pharmacy   

## 2018-04-24 ENCOUNTER — Encounter: Payer: Self-pay | Admitting: Family Medicine

## 2018-04-24 ENCOUNTER — Ambulatory Visit (INDEPENDENT_AMBULATORY_CARE_PROVIDER_SITE_OTHER): Payer: 59 | Admitting: Family Medicine

## 2018-04-24 ENCOUNTER — Encounter: Payer: Self-pay | Admitting: Sports Medicine

## 2018-04-24 ENCOUNTER — Ambulatory Visit (INDEPENDENT_AMBULATORY_CARE_PROVIDER_SITE_OTHER): Payer: 59 | Admitting: Sports Medicine

## 2018-04-24 VITALS — BP 124/76 | HR 67 | Temp 97.8°F | Ht 74.0 in | Wt 197.6 lb

## 2018-04-24 VITALS — BP 124/76 | HR 67 | Ht 74.0 in | Wt 197.6 lb

## 2018-04-24 DIAGNOSIS — L299 Pruritus, unspecified: Secondary | ICD-10-CM

## 2018-04-24 DIAGNOSIS — S9782XA Crushing injury of left foot, initial encounter: Secondary | ICD-10-CM

## 2018-04-24 DIAGNOSIS — M79672 Pain in left foot: Secondary | ICD-10-CM

## 2018-04-24 MED ORDER — DICLOFENAC SODIUM 75 MG PO TBEC: 75.0000 mg | DELAYED_RELEASE_TABLET | Freq: Two times a day (BID) | ORAL | 0 refills | Status: DC

## 2018-04-24 NOTE — Patient Instructions (Addendum)
We are ordering an MRI for you today.  The imaging office will be calling you to schedule your appointment after we obtain authorization from your insurance company.   Please be sure you have signed up for MyChart so that we can get your results to you.  We will be in touch with you as soon as we can.  Please know, it can take up to 3-4 business days for the radiologist and Dr. Rigby to have time to review the results and determine the best appropriate action.  If there is something that appears to be surgical or needs a referral to other specialists we will let you know through MyChart or telephone.  Otherwise we will plan to schedule a follow up appointment with Dr. Rigby once we have the results.    [facility]: [phone]  

## 2018-04-24 NOTE — Progress Notes (Signed)
   Subjective:  Brandon Maldonado is a 33 y.o. male who presents today with a chief complaint of rash.   HPI:  Rash, new problem Started about a week ago.  Started on arms legs and has spread all over.  Rash is very itchy.  Worse after showering.  He has tried cortisone cream which is helped.  No new exposures.  No new soaps or detergents.  No fevers or chills.  No recent illnesses.  He has noted some small red bumps on his bilateral lower extremities.  He is also concerned that he could possibly have some dry skin. No other obvious aggravating or alleviating factors.   Foot Pain Was seen about a month ago for foot pain after being run over by a small piece of equipment at work.  X-ray was negative for acute fracture.  He was placed in a rigid sole shoe and started on diclofenac.  Symptoms have not improved over the last couple of weeks.  He has been compliant with diclofenac.  He has been using his rigid sole shoe consistently.  ROS: Per HPI  PMH: He reports that he has never smoked. He has never used smokeless tobacco. He reports current alcohol use of about 2.0 - 3.0 standard drinks of alcohol per week. He reports that he does not use drugs.  Objective:  Physical Exam: BP 124/76 (BP Location: Left Arm, Patient Position: Sitting, Cuff Size: Normal)   Pulse 67   Temp 97.8 F (36.6 C) (Oral)   Ht 6\' 2"  (1.88 m)   Wt 197 lb 9.6 oz (89.6 kg)   SpO2 97%   BMI 25.37 kg/m   Gen: NAD, resting comfortably Skin: Diffuse xerosis noted.  Several punctate erythematous lesions on bilateral lower extremities surrounding hair follicles.  Few scattered excoriations noted as well. MSK: -Left foot: First MTP joint tender to palpation.  Full range of motion.  No palpable click with lateral squeeze.  Neurovascular intact distally.  Assessment/Plan:  Pruritus Secondary to xerosis cutis.  Recommended once or twice daily topical emollient.  Recommended against use of harsh soaps and detergents.   Recommended over-the-counter antihistamine such as Claritin or Zyrtec for his pruritus.  Discussed reasons to return to care.  Follow-up as needed.  Left foot pain Patient has not improved with oral anti-inflammatories and post op shoe.  He has some degenerative change on his plain film, but would not expect this to be causing the amount of symptoms he is having.  It is possible he could have a neuroma.  He will continue using diclofenac and his postop shoe.  Will place referral to sports medicine for further evaluation.  Katina Degree. Jimmey Ralph, MD 04/24/2018 9:37 AM

## 2018-04-24 NOTE — Patient Instructions (Signed)
It was very nice to see you today!  Please come back to see Dr Berline Chough soon for your foot.  You have dry skin. Please use lotion twice daily and avoid harsh soaps. You can use claritin or zyrtec for your itching.   Let me know if your symptoms worsen or do not improve in the next few days.   Take care, Dr Jimmey Ralph

## 2018-04-24 NOTE — Progress Notes (Signed)
Veverly FellsMichael D. Delorise Shinerigby, DO  [facility] [place] [phone]  Adela PortsBrandon Pope Brunty Dudzinski - 33 y.o. male MRN [id number]  Date of birth: 1986-02-20  Visit Date:   PCP: Ardith DarkParker, Caleb M, MD   Referred by: Ardith DarkParker, Caleb M, MD   SUBJECTIVE:  Chief Complaint  Patient presents with  . Initial Assessment    Referred by Dr. Jacquiline Doealeb Parker.   . Pain of the L foot    Foot was run over by small piece of equiptment at work 03/2018. XR 03/27/2018. Has been wearing post-op boot and taking Diclofenac with no improvement.     HPI: Patient presents today for second opinion on left distal third medial foot toe pain.  He underwent a crush injury at work having a seed spreader run directly over the distal aspect of the medial column of his foot.  He had some pain at the time but was able to continue working.  He has had progressive pain since that time and was seen by Dr. Jimmey RalphParker and started on diclofenac as well as placed into a postoperative shoe.  This was 6 weeks after his initial injury first presented.  He has been in the shoe and take medicines for 4 weeks.  He is actually continuing to have progressive symptoms and describes more of generalized foot pain with some improvement in the great toe pain but he is still unable to fully extend the great toe against resistance.  This does not keep him awake at night.  Using the treatments outlined above he is only had mild improvements.  REVIEW OF SYSTEMS: 12 point review of systems reviewed and is negative other than history of headaches from migraines.  HISTORY:  Prior history reviewed and updated per electronic medical record.  Social History   Occupational History  . Not on file  Tobacco Use  . Smoking status: Never Smoker  . Smokeless tobacco: Never Used  Substance and Sexual Activity  . Alcohol use: Yes    Alcohol/week: 2.0 - 3.0 standard drinks    Types: 2 - 3 Standard drinks or equivalent per week    Comment: 1  if any occasionally drinker  . Drug use: No  . Sexual activity: Yes   Social History   Social History Narrative   Patient is married, with no children.   Patient is right handed.   Patient has a high school graduate, and has some college education.   Patient drinks 1 cup of coffee daily.    Past Medical History:  Diagnosis Date  . Esophageal reflux   . Migraine   . Other bursitis disorders      Past Surgical History:  Procedure Laterality Date  . BACK SURGERY  2008  . CHEST SURGERY  2008   moved muscle  . SHOULDER SURGERY Right 2009   LABRAL TEAR  . SHOULDER SURGERY Left 2008  . TOE SURGERY Left 2010   4th toe, removed cyst    family history includes Cancer in his maternal grandfather, maternal grandmother, and paternal grandmother; Cancer - Lung in his maternal grandmother; Hyperlipidemia in his maternal grandfather; Leukemia in his maternal grandfather; Lung cancer in his paternal grandmother; Vitamin D deficiency in his mother.  DATA OBTAINED & REVIEWED:  No results for input(s): HGBA1C, LABURIC, CREATINE, CALCIUM, AST, ALT, TSH in the last 8760 hours.  Invalid input(s): MAGNESIUM, CK No problems updated. No specialty comments available.  OBJECTIVE:  VS:  HT:6\' 2"  (188 cm)   WT:197  lb 9.6 oz (89.6 kg)  BMI:25.36    BP:124/76  HR:67bpm  TEMP: ( )  RESP:97 %   PHYSICAL EXAM: Adult male.  In no acute distress.  Alert and appropriate.  His bilateral lower extremities overall well aligned.  His left foot is normal-appearing without significant bruising swelling or ecchymosis.  He does have some pain with metatarsal squeeze but this is minimal he has most focal pain with plantar and dorsal compression of the first webspace.  This is mild and not exactly visible.  There is a softness in this.  To the swelling but no pitting edema.  DP and PT pulses are 2+/4.  Palpation of the EHL tendon does not reveal any defects however he has effectively no motion or tensioning of  the EHL with great toe extension.  He is able to curl his toes without difficulty as well as splaying his toes against resistance.  He has good ankle dorsiflexion and normal ankle strength.  Negative straight leg.   ASSESSMENT  No diagnosis found.  PLAN:  Pertinent additional documentation may be included in corresponding procedure notes, imaging studies, problem based documentation and patient instructions.  Procedures:  None  Medications:  Meds ordered this encounter  Medications  . diclofenac (VOLTAREN) 75 MG EC tablet    Sig: Take 1 tablet (75 mg total) by mouth 2 (two) times daily.    Dispense:  60 tablet    Refill:  0    Discussion/Instructions:      No problem-specific Assessment & Plan notes found for this encounter. RICE (Rest, ICE, Compression, Elevation) principles reviewed with the patient. Discussed appropriate use of both heat and ice with the patient today.  Discussed red flag symptoms that warrant earlier emergent evaluation and patient voices understanding.   Ultimately given the crush mechanism and the lack of great toe extension further definitive diagnosis with MRI recommended at this time.  The extensor houses longus tendon does appear to be patient but I am concerned given the crush injury mechanism if there is potentially a significant amount of damage to the first intermetatarsal space possibly from a outcome of possible compartment syndrome which can be seen with crush injuries.  He can wean out of the postoperative shoe if this seems to be continuing to give him pain.  I have given him a refill on his diclofenac to be taken if needed but it is okay to wean off of this as well if it does not seem to be providing him significant benefit.    No follow-ups on file.             Andrena MewsMichael D Nidal Rivet, DO    [facility]

## 2018-04-27 ENCOUNTER — Other Ambulatory Visit: Payer: 59

## 2018-05-04 ENCOUNTER — Ambulatory Visit
Admission: RE | Admit: 2018-05-04 | Discharge: 2018-05-04 | Disposition: A | Discharge status: Home / Self Care | Payer: 59 | Source: Ambulatory Visit | Attending: Sports Medicine | Admitting: Sports Medicine

## 2018-05-04 DIAGNOSIS — M79672 Pain in left foot: Secondary | ICD-10-CM

## 2018-05-18 NOTE — Progress Notes (Signed)
I would like to plan to have him come back to go over these results.  We can consider an injection into the first webspace for the underlying intermetatarsal bursitis.  Overall these findings are reassuring.  Please call to schedule follow-up appointment.

## 2018-05-29 ENCOUNTER — Encounter: Payer: Self-pay | Admitting: Sports Medicine

## 2018-05-29 ENCOUNTER — Ambulatory Visit (INDEPENDENT_AMBULATORY_CARE_PROVIDER_SITE_OTHER): Payer: 59 | Admitting: Sports Medicine

## 2018-05-29 VITALS — BP 120/78 | HR 74 | Ht 74.0 in | Wt 202.6 lb

## 2018-05-29 DIAGNOSIS — R269 Unspecified abnormalities of gait and mobility: Secondary | ICD-10-CM | POA: Diagnosis not present

## 2018-05-29 DIAGNOSIS — Q798 Other congenital malformations of musculoskeletal system: Secondary | ICD-10-CM | POA: Diagnosis not present

## 2018-05-29 DIAGNOSIS — M79672 Pain in left foot: Secondary | ICD-10-CM

## 2018-05-29 DIAGNOSIS — M24552 Contracture, left hip: Secondary | ICD-10-CM | POA: Diagnosis not present

## 2018-05-29 DIAGNOSIS — M7632 Iliotibial band syndrome, left leg: Secondary | ICD-10-CM

## 2018-05-29 HISTORY — DX: Pain in left foot: M79.672

## 2018-05-29 MED ORDER — METHYLPREDNISOLONE 4 MG PO TBPK: ORAL_TABLET | ORAL | 0 refills | Status: DC

## 2018-05-29 NOTE — Patient Instructions (Addendum)
Also check out [employer]" which is a program developed by Dr. Myles Lipps.   There are links to a couple of his YouTube Videos below and I would like to see you performing one of his videos 5-6 days per week.  It is best to do these exercises first thing in the morning.  They will give you a good jumpstart here today and start normalizing the way you move.  A good intro video is: "Independence from Pain 7-minute Video" - [email or web address]   A more advanced video is: Scientist, research (medical) original 12 minutes" - [email or web address]  Exercises that focus more on the neck are as below: Dr. Derrill Kay with [employer] teaching neck and shoulder details Part 1 - https://[email or web address].[email or web address] Part 2 Dr. Derrill Kay with [facility] quick routine to practice daily - https://youtu.be/[email or web address]  Do not try to attempt the entire video when first beginning.  Try breaking of each exercise that he goes into shorter segments.  In other words, if they perform an exercise for 45 seconds, start with 15 seconds and rest and then resume when they begin the new activity.  If you work your way up to being able to do these videos without having to stop, I expect you will see significant improvements in your pain.  If you enjoy his videos and would like to find out more you can look on his website: [email or web address].  He has a workout streaming option as well as a DVD set available for purchase.  Amazon has the best price for his DVDs.     Please perform the exercise program that we have prepared for you and gone over in detail on a daily basis.  In addition to the handout you were provided you can access your program through: [email or web address]-[email or web address]   Your unique program code is: [email or web address]   The cost of the pair of custom orthotics is $195.  You can look into having your insurance company cover the cost of these. Some insurance companies cover the  cost and other do not.  If they do not you will be responsible for the full cost of the orthotics.  I am happy to do these for you at any time, you just need to let our front office schedulers know you would like an "orthotic appointment."  Please also make sure you bring athletic shoes with you on the day of your orthotic appointment or whatever shoes you plan to wear your orthotics in most frequently.   When you call your insurance company you will need to provide them the CPT code which is L3030 and there are 2 units.  You can call them  and ask if this is covered.

## 2018-05-29 NOTE — Progress Notes (Signed)
Brandon Maldonado. [facility] at [facility] [phone]  Brandon Maldonado - 33 y.o. male MRN [id number]  Date of birth: 03-24-1986  Visit Date: May 29, 2018  PCP: Brandon Dark, MD   Referred by: Brandon Dark, MD  SUBJECTIVE:  Chief Complaint  Patient presents with  . Follow-up    L foot / great toe pain.  Diclofenac.  L foot MRI review today.    HPI: Patient is here today for multiple issues.  Interestingly he does have a history of [place] syndrome and had a lap transfer procedure that has caused him significant left shoulder left back and low back pain for the majority of his life.  He is also reporting recurrence of left hip and knee pain that is previously been diagnosed with IT band syndrome and pain radiates from his hip to his knee never past his knee.  Is worse with prolonged walking.  His left great toe is continue to have some pain that is worse with prolonged walking but he is actually been able to change jobs and this has been beneficial.  REVIEW OF SYSTEMS: No significant nighttime awakenings due to this issue. Denies fevers, chills, recent weight gain or weight loss.  No night sweats.  Pt denies any change in bowel or bladder habits, muscle weakness, numbness or falls associated with this pain.  HISTORY:  Prior history reviewed and updated per electronic medical record.  Patient Active Problem List   Diagnosis Date Noted  . Left foot pain 05/29/2018    L great toe XR - 03/27/18  L foot MRI - 05/04/18   . Migraine   . Esophageal reflux   . 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 Not on file  Tobacco Use  . Smoking status: Never Smoker  . Smokeless tobacco: Never Used  Substance and Sexual Activity  . Alcohol use: Yes    Alcohol/week: 2.0 - 3.0 standard drinks    Types: 2 - 3 Standard drinks or  equivalent per week    Comment: 1 if any occasionally drinker  . Drug use: No  . Sexual activity: Yes   Social History   Social History Narrative   Patient is married, with no children.   Patient is right handed.   Patient has a high school graduate, and has some college education.   Patient drinks 1 cup of coffee daily.    OBJECTIVE:  VS:  HT:6\' 2"  (188 cm)   WT:202 lb 9.6 oz (91.9 kg)  BMI:26    BP:120/78  HR:74bpm  TEMP: ( )  RESP:98 %   PHYSICAL EXAM: Adult male. No acute distress.  Alert and appropriate. Left foot is overall well aligned without significant deformity.  He does have longitudinal arch collapse with weightbearing and splay toe between his first and second toes bilaterally worse actually on the right than the left believe this is secondary to worsening slight valgus deformity/early bunion of the left great toe.  He has pain with metatarsal squeeze test that is mild.  Negative Mulder's click.  No significant pain with terminal toe flexion or extension.  His left hip and knee are well aligned without significant focal pain but he does have pain over the lateral IT band just proximal to the knee.  No knee joint line pain.  Knee flexion extension strength is normal and  range of motion is normal.  He does have a TFL predominant recruitment pattern and pain with resisted hip abduction.  Strength is well-preserved.  His upper back and low back are markedly tight especially on the left.  He is previously had surgery.  The surgery scars are nontender.  His left shoulder is elevated compared to his right.   ASSESSMENT:  1. Gait disturbance   2. Left foot pain   3. [place] syndrome   4. Left hip flexor tightness   5. Iliotibial band syndrome of left side     PROCEDURES:  PROCEDURE NOTE: THERAPEUTIC EXERCISES (38177)  Discussed the foundation of treatment for this condition is physical therapy and/or daily (5-6 days/week) therapeutic exercises, focusing on core  strengthening, coordination, neuromuscular control/reeducation. 15 minutes spent for Therapeutic exercises as below and as referenced in the AVS. This included exercises focusing on stretching, strengthening, with significant focus on eccentric aspects.  Proper technique shown and discussed handout in great detail with ATC. All questions were discussed and answered.  Long term goals include an improvement in range of motion, strength, endurance as well as avoiding reinjury. Frequency of visits is one time as determined during today's office visit. Frequency of exercises to be performed is as per handout. EXERCISES REVIEWED: Derrill Kay Exercises Hip ABduction strengthening with focus on Glute Medius Recruitment IT Band Stretching     PLAN:  Pertinent additional documentation may be included in corresponding procedure notes, imaging studies, problem based documentation and patient instructions.  No problem-specific Assessment & Plan notes found for this encounter.   Fairly interesting medical history that is likely contributing to significant anterior chain dominance in the setting of force coupling discrepancies between the upper and lower spine and anterior and posterior chest wall given the underlying [place] syndrome and prior latissimus transfer.  He is not performing much physical activity and will benefit from foundations training videos.  Given the underlying diffuse pain and only mild improvement to NSAIDs will try systemic steroids.  Links to [employer] provided today per Patient Instructions.  These exercises were developed by Myles Lipps, DC with a strong emphasis on core neuromuscular reducation and postural realignment through body-weight exercises.  Home Therapeutic exercises prescribed today per procedure note.  Discussed the underlying features of tight hip flexors leading to crouched, fetal like position that results in spinal column compression.  Including  lumbar hyperflexion with hypermobility, thoracic flexion with restrictive rotation and cervical lordosis reversal.   Custom cushioned insoles discussed today and he will consider following up for these.  In the interim we did place a small longitudinal metatarsal arch pad in both of his shoes today he did report some improvement in his symptoms.  Activity modifications and the importance of avoiding exacerbating activities (limiting pain to no more than a 4 / 10 during or following activity) recommended and discussed.  Discussed red flag symptoms that warrant earlier emergent evaluation and patient voices understanding.   Meds ordered this encounter  Medications  . methylPREDNISolone (MEDROL DOSEPAK) 4 MG TBPK tablet    Sig: Take by mouth as directed. Take 6 tablets on the first day prescribed then as directed.    Dispense:  21 tablet    Refill:  0   Lab Orders  No laboratory test(s) ordered today   Imaging Orders  No imaging studies ordered today   Referral Orders  No referral(s) requested today      Return in about 4 weeks (around 06/26/2018).  Gerda Diss, [facility]ician

## 2018-06-04 ENCOUNTER — Other Ambulatory Visit: Payer: Self-pay | Admitting: Sports Medicine

## 2018-06-15 ENCOUNTER — Ambulatory Visit (INDEPENDENT_AMBULATORY_CARE_PROVIDER_SITE_OTHER): Payer: 59 | Admitting: Family Medicine

## 2018-06-15 ENCOUNTER — Encounter: Payer: Self-pay | Admitting: Family Medicine

## 2018-06-15 VITALS — BP 124/78 | HR 90 | Temp 99.8°F | Ht 74.0 in | Wt 197.4 lb

## 2018-06-15 DIAGNOSIS — J101 Influenza due to other identified influenza virus with other respiratory manifestations: Secondary | ICD-10-CM | POA: Diagnosis not present

## 2018-06-15 LAB — POCT INFLUENZA A/B
INFLUENZA B, POC: NEGATIVE
Influenza A, POC: POSITIVE — AB

## 2018-06-15 MED ORDER — OSELTAMIVIR PHOSPHATE 75 MG PO CAPS: 75.0000 mg | ORAL_CAPSULE | Freq: Two times a day (BID) | ORAL | 0 refills | Status: DC

## 2018-06-15 NOTE — Patient Instructions (Signed)
It was very nice to see you today!  You have the flu.  Please start the Tamiflu.  Please make sure that you get plenty of fluids.  You can take Tylenol as/or Motrin as needed.  Please let her know if your symptoms worsen or not improve in the next few days.  Take care, Dr Jimmey Ralph

## 2018-06-15 NOTE — Progress Notes (Signed)
   Chief Complaint:  Mihailo Albrecht is a 33 y.o. male who presents for same day appointment with a chief complaint of fever.   Assessment/Plan:  Influenza A Rapid flu positive.  Start Tamiflu 75 mg twice daily x5 days.  Encouraged good oral hydration.  Continue over-the-counter analgesics as needed for pain and fever.  Discussed reasons to return to care.  Follow-up as needed.     Subjective:  HPI:  Fever, acute problem Started 2 days ago. Worsening over that time. Associated with cough, body aches, and malaise.  He has tried taking mucinex, tylenol, and motrin which have helped some. Neighbors have been sick with similar symptoms.  No other obvious alleviating or aggravating factors.   ROS: Per HPI  PMH: He reports that he has never smoked. He has never used smokeless tobacco. He reports current alcohol use of about 2.0 - 3.0 standard drinks of alcohol per week. He reports that he does not use drugs.      Objective:  Physical Exam: BP 124/78 (BP Location: Left Arm, Patient Position: Sitting, Cuff Size: Normal)   Pulse 90   Temp 99.8 F (37.7 C) (Oral)   Ht 6\' 2"  (1.88 m)   Wt 197 lb 6.1 oz (89.5 kg)   SpO2 96%   BMI 25.34 kg/m   Gen: NAD, resting comfortably CV: Regular rate and rhythm with no murmurs appreciated Pulm: Normal work of breathing, clear to auscultation bilaterally with no crackles, wheezes, or rhonchi  Results for orders placed or performed in visit on 06/15/18 (from the past 24 hour(s))  POCT Influenza A/B     Status: Abnormal   Collection Time: 06/15/18 11:42 AM  Result Value Ref Range   Influenza A, POC Positive (A) Negative   Influenza B, POC Negative Negative        Dequincy Born M. Jimmey Ralph, MD 06/15/2018 11:45 AM

## 2018-06-26 ENCOUNTER — Ambulatory Visit: Payer: 59 | Admitting: Sports Medicine

## 2019-12-14 ENCOUNTER — Ambulatory Visit: Payer: Self-pay

## 2019-12-14 ENCOUNTER — Ambulatory Visit (INDEPENDENT_AMBULATORY_CARE_PROVIDER_SITE_OTHER): Payer: 59 | Admitting: Family Medicine

## 2019-12-14 ENCOUNTER — Encounter: Payer: Self-pay | Admitting: Family Medicine

## 2019-12-14 ENCOUNTER — Other Ambulatory Visit: Payer: Self-pay

## 2019-12-14 VITALS — BP 130/88 | HR 76 | Ht 74.0 in | Wt 195.0 lb

## 2019-12-14 DIAGNOSIS — M25521 Pain in right elbow: Secondary | ICD-10-CM | POA: Diagnosis not present

## 2019-12-14 NOTE — Progress Notes (Signed)
    Subjective:    CC: R elbow pain  I, Brandon Maldonado, LAT, ATC, am serving as scribe for Dr. Clementeen Graham.  HPI: Pt is a 34 y/o male presenting w/ c/o R elbow pain after he fell hiking in June and landed on the R side of his body.  He locates his pain to his R olecranon.  He notes pain occurs with pressure.  No significant pain with wrist or elbow motion.  Symptoms are worsening slightly.  R elbow swelling: No Aggravating factors: pressure to the exact area where it hurts Treatments tried: nothing  Pertinent review of Systems: No fevers or chills  Relevant historical information: Healthy young man.  Works as a Teacher, music.   Objective:    Vitals:   12/14/19 0811  BP: 130/88  Pulse: 76  SpO2: 98%   General: Well Developed, well nourished, and in no acute distress.   MSK: Right elbow normal-appearing Normal motion. Tiny tenderness palpation at olecranon approximately 1 cm distal to olecranon prominence.  Small cortical nodule palpated in this region on mobile. Normal elbow strength. Normal wrist strength without pain. Pulses cap refill and sensation are intact distally.  Lab and Radiology Results Diagnostic Limited MSK Ultrasound of: Right elbow olecranon prominence Cortex largely normal-appearing.  Direct visualization of palpable prominence in area of tenderness largely normal-appearing without cortical defect or hypoechoic fluid collection. Impression: Cortical bone bruise     Impression and Recommendations:    Assessment and Plan: 34 y.o. male with likely bruise cortex bone.  This should improve with padding and Voltaren gel.  Recommend elbow compression sleeve and pad along with Voltaren gel.  Also recommend exercises consistent with tennis elbow and golfer's elbow as some of the muscles involved in these conditions that originate or insert in this area may be involved as well.  Recheck as needed if not improved.  Next step may be  nitroglycerin patch protocol   Orders Placed This Encounter  Procedures  . [place] LIMITED JOINT SPACE STRUCTURES UP RIGHT(NO LINKED CHARGES)    Order Specific Question:   Reason for Exam (SYMPTOM  OR DIAGNOSIS REQUIRED)    Answer:   R elbow pain    Order Specific Question:   Preferred imaging location?    Answer:   [facility]   No orders of the defined types were placed in this encounter.   Discussed warning signs or symptoms. Please see discharge instructions. Patient expresses understanding.   The above documentation has been reviewed and is accurate and complete Clementeen Graham, M.D.

## 2019-12-14 NOTE — Patient Instructions (Signed)
Thank you for coming in today. Plan for voltaren gel.  Use body helix full elbow sleeve as needed for padding.  Do exercises for golfer elbow and tennis elbow.  If not better let me know.    Golfer's Elbow Rehab Ask your health care provider which exercises are safe for you. Do exercises exactly as told by your health care provider and adjust them as directed. It is normal to feel mild stretching, pulling, tightness, or discomfort as you do these exercises. Stop right away if you feel sudden pain or your pain gets worse. Do not begin these exercises until told by your health care provider. Stretching and range-of-motion exercises These exercises warm up your muscles and joints and improve the movement and flexibility of your elbow. Wrist extension  1. Straighten your left / right elbow in front of you with your palm facing up toward the ceiling. ? If told by your health care provider, bend your left / right elbow to a 90-degree angle (right angle) at your side. 2. With your other hand, gently pull your left / right hand and fingers toward the floor (extension). Stop when you feel a gentle stretch on the palm side of your forearm. 3. Hold this position for __________ seconds. Repeat __________ times. Complete this exercise __________ times a day. Wrist flexion  1. Straighten your left / right elbow in front of you with your palm facing down toward the floor. ? If told by your health care provider, bend your left / right elbow to a 90-degree angle (right angle) at your side. 2. With your other hand, gently push over the back of your left / right hand so your fingers point toward the floor (flexion). Stop when you feel a gentle stretch on the back of your forearm. 3. Hold this position for __________ seconds. Repeat __________ times. Complete this exercise __________ times a day. Forearm rotation, supination 1. Sit or stand with your elbows at your side. 2. Bend your left / right elbow to a  90-degree angle (right angle). 3. Using your uninjured hand, turn your left / right palm up toward the ceiling (supination) until you feel a gentle stretch along the inside of your forearm. 4. Hold this position for __________ seconds. Repeat __________ times. Complete this exercise __________ times a day. Forearm rotation, pronation 1. Sit or stand with your elbows at your side. 2. Bend your left / right elbow to a 90-degree angle (right angle). 3. Using your uninjured hand, turn your left / right palm down toward the floor (pronation) until you feel a gentle stretch along the top of your forearm. 4. Hold this position for __________ seconds. Repeat __________ times. Complete this exercise __________ times a day. Strengthening exercises These exercises build strength and endurance in your elbow. Endurance is the ability to use your muscles for a long time, even after they get tired. Wrist flexion  1. Sit with your left / right forearm supported on a table or other surface and your palm turned up toward the ceiling. Let your left / right wrist extend over the edge of the surface. 2. Hold a __________ weight or a piece of rubber exercise band or tubing. ? If using a rubber exercise band or tubing, hold the other end of the tubing with your other hand. 3. Slowly bend your wrist so your hand moves up toward the ceiling (flexion). Try to only move your wrist and keep the rest of your arm still. 4. Hold this position  for __________ seconds. 5. Slowly return to the starting position. Repeat __________ times. Complete this exercise __________ times a day. Wrist flexion, eccentric 1. Sit with your left / right forearm palm-up and supported on a table or other surface. Let your left / right wrist extend over the edge of the surface. 2. Hold a __________ weight or a piece of rubber exercise band or tubing in your left / right hand. ? If using a rubber exercise band or tubing, hold the other end of the  tubing with your other hand. 3. Use your uninjured hand to move your left / right hand up toward the ceiling. 4. Take your uninjured hand away and slowly return to the starting position using only your left / right hand (eccentric flexion). Repeat __________ times. Complete this exercise __________ times a day. Forearm rotation, pronation To do this exercise, you will need a lightweight hammer or rubber mallet. 1. Sit with your left / right forearm supported on a table or other surface. Bend your elbow to a 90-degree angle (right angle). Position your forearm so that your palm is facing up toward the ceiling, with your hand resting over the edge of the table. 2. Hold a hammer in your left / right hand. ? To make this exercise easier, hold the hammer near the head of the hammer. ? To make this exercise harder, hold the hammer near the end of the handle. 3. Without moving your elbow, slowly turn (rotate) your forearm so your palm faces down toward the floor (pronation). 4. Hold this position for __________ seconds. 5. Slowly return to the starting position. Repeat __________ times. Complete this exercise __________ times a day. Shoulder blade squeeze 1. Sit in a stable chair or stand with good posture. If you are sitting down, do not let your back touch the back of the chair. 2. Your arms should be at your sides with your elbows bent to a 90-degree angle (right angle). Position your forearms so that your thumbs are facing the ceiling (neutral position). 3. Without lifting your shoulders up, squeeze your shoulder blades tightly together. 4. Hold this position for __________ seconds. 5. Slowly release and return to the starting position. Repeat __________ times. Complete this exercise __________ times a day. This information is not intended to replace advice given to you by your health care provider. Make sure you discuss any questions you have with your health care provider. Document Revised:  07/23/2018 Document Reviewed: 05/26/2018 Elsevier Patient Education  2020 [employer].    Tennis Elbow Rehab Ask your health care provider which exercises are safe for you. Do exercises exactly as told by your health care provider and adjust them as directed. It is normal to feel mild stretching, pulling, tightness, or discomfort as you do these exercises. Stop right away if you feel sudden pain or your pain gets worse. Do not begin these exercises until told by your health care provider. Stretching and range-of-motion exercises These exercises warm up your muscles and joints and improve the movement and flexibility of your elbow. These exercises also help to relieve pain, numbness, and tingling. Wrist flexion, assisted  4. Straighten your left / right elbow in front of you with your palm facing down toward the floor. ? If told by your health care provider, bend your left / right elbow to a 90-degree angle (right angle) at your side. 5. With your other hand, gently push over the back of your left / right hand so your fingers point  toward the floor (flexion). Stop when you feel a gentle stretch on the back of your forearm. 6. Hold this position for __________ seconds. Repeat __________ times. Complete this exercise __________ times a day. Wrist extension, assisted  4. Straighten your left / right elbow in front of you with your palm facing up toward the ceiling. ? If told by your health care provider, bend your left / right elbow to a 90-degree angle (right angle) at your side. 5. With your other hand, gently pull your left / right hand and fingers toward the floor (extension). Stop when you feel a gentle stretch on the palm side of your forearm. 6. Hold this position for __________ seconds. Repeat __________ times. Complete this exercise __________ times a day. Assisted forearm rotation, supination 5. Sit or stand with your left / right elbow bent to a 90-degree angle (right angle) at your  side. 6. Using your uninjured hand, turn (rotate) your left / right palm up toward the ceiling (supination) until you feel a gentle stretch along the inside of your forearm. 7. Hold this position for __________ seconds. Repeat __________ times. Complete this exercise __________ times a day. Assisted forearm rotation, pronation 5. Sit or stand with your left / right elbow bent to a 90-degree angle (right angle) at your side. 6. Using your uninjured hand, rotate your left / right palm down toward the floor (pronation) until you feel a gentle stretch along the outside of your forearm. 7. Hold this position for __________ seconds. Repeat __________ times. Complete this exercise __________ times a day. Strengthening exercises These exercises build strength and endurance in your forearm and elbow. Endurance is the ability to use your muscles for a long time, even after they get tired. Radial deviation  6. Stand with a __________ weight or a hammer in your left / right hand. Or, sit while holding a rubber exercise band or tubing, with your left / right forearm supported on a table or countertop. ? If you are standing, position your forearm so that your thumb is facing forward. If you are sitting, position your forearm so that the thumb is facing the ceiling. This is the neutral position. 7. Raise your hand upward in front of you so your thumb moves toward the ceiling (radial deviation), or pull up on the rubber tubing. Keep your forearm and elbow still while you move your wrist only. 8. Hold this position for __________ seconds. 9. Slowly return to the starting position. Repeat __________ times. Complete this exercise __________ times a day. Wrist extension, eccentric 5. Sit with your left / right forearm palm-down and supported on a table or other surface. Let your left / right wrist extend over the edge of the surface. 6. Hold a __________ weight or a piece of exercise band or tubing in your left /  right hand. ? If using a rubber exercise band or tubing, hold the other end of the tubing with your other hand. 7. Use your uninjured hand to move your left / right hand up toward the ceiling. 8. Take your uninjured hand away and slowly return to the starting position using only your left / right hand. Lowering your arm under tension is called eccentric extension. Repeat __________ times. Complete this exercise __________ times a day. Wrist extension Do not do this exercise if it causes pain at the outside of your elbow. Only do this exercise once instructed by your health care provider. 6. Sit with your left / right forearm supported  on a table or other surface and your palm turned down toward the floor. Let your left / right wrist extend over the edge of the surface. 7. Hold a __________ weight or a piece of rubber exercise band or tubing. ? If you are using a rubber exercise band or tubing, hold the band or tubing in place with your other hand to provide resistance. 8. Slowly bend your wrist so your hand moves up toward the ceiling (extension). Move only your wrist, keeping your forearm and elbow still. 9. Hold this position for __________ seconds. 10. Slowly return to the starting position. Repeat __________ times. Complete this exercise __________ times a day. Forearm rotation, supination To do this exercise, you will need a lightweight hammer or rubber mallet. 6. Sit with your left / right forearm supported on a table or other surface. Bend your elbow to a 90-degree angle (right angle). Position your forearm so that your palm is facing down toward the floor, with your hand resting over the edge of the table. 7. Hold a hammer in your left / right hand. ? To make this exercise easier, hold the hammer near the head of the hammer. ? To make this exercise harder, hold the hammer near the end of the handle. 8. Without moving your wrist or elbow, slowly rotate your forearm so your palm faces up  toward the ceiling (supination). 9. Hold this position for __________ seconds. 10. Slowly return to the starting position. Repeat __________ times. Complete this exercise __________ times a day. Shoulder blade squeeze 1. Sit in a stable chair or stand with good posture. If you are sitting down, do not let your back touch the back of the chair. 2. Your arms should be at your sides with your elbows bent to a 90-degree angle (right angle). Position your forearms so that your thumbs are facing the ceiling (neutral position). 3. Without lifting your shoulders up, squeeze your shoulder blades tightly together. 4. Hold this position for __________ seconds. 5. Slowly release and return to the starting position. Repeat __________ times. Complete this exercise __________ times a day. This information is not intended to replace advice given to you by your health care provider. Make sure you discuss any questions you have with your health care provider. Document Revised: 07/23/2018 Document Reviewed: 05/26/2018 Elsevier Patient Education  2020 [employer]

## 2020-03-20 ENCOUNTER — Other Ambulatory Visit: Payer: Self-pay

## 2020-03-20 ENCOUNTER — Ambulatory Visit (INDEPENDENT_AMBULATORY_CARE_PROVIDER_SITE_OTHER): Payer: Self-pay | Admitting: Family Medicine

## 2020-03-20 ENCOUNTER — Telehealth: Payer: 59 | Admitting: Family Medicine

## 2020-03-20 VITALS — BP 115/78 | HR 72 | Temp 98.3°F | Ht 74.0 in | Wt 181.2 lb

## 2020-03-20 DIAGNOSIS — J029 Acute pharyngitis, unspecified: Secondary | ICD-10-CM

## 2020-03-20 LAB — POCT RAPID STREP A (OFFICE): Rapid Strep A Screen: NEGATIVE

## 2020-03-20 MED ORDER — PREDNISONE 50 MG PO TABS: ORAL_TABLET | ORAL | 0 refills | Status: DC

## 2020-03-20 MED ORDER — GUAIFENESIN-CODEINE 100-10 MG/5ML PO SOLN: 5.0000 mL | Freq: Three times a day (TID) | ORAL | 0 refills | Status: DC | PRN

## 2020-03-20 NOTE — Patient Instructions (Signed)
It was very nice to see you today!  Your strep test is negative.  It is possible that you could have mononucleosis or some other viral illness.  We will treat your symptoms with cough syrup and prednisone.  Let me know if not improving by next week.  Take care, Dr Jimmey Ralph  Please try these tips to maintain a healthy lifestyle:   Eat at least 3 REAL meals and 1-2 snacks per day.  Aim for no more than 5 hours between eating.  If you eat breakfast, please do so within one hour of getting up.    Each meal should contain half fruits/vegetables, one quarter protein, and one quarter carbs (no bigger than a computer mouse)   Cut down on sweet beverages. This includes juice, soda, and sweet tea.     Drink at least 1 glass of water with each meal and aim for at least 8 glasses per day   Exercise at least 150 minutes every week.

## 2020-03-20 NOTE — Progress Notes (Signed)
   Brandon Maldonado is a 34 y.o. male who presents today for an office visit.  Assessment/Plan:  New/Acute Problems: Pharyngitis Strep test negative.  No red flags. Possibly infectious mononucleosis versus other viral upper respiratory infection.  No red flags.  Will treat symptomatically with prednisone and guaifenesin-codeine cough syrup.  Discussed lab testing today however decided to defer at this point as it would not significantly change management.  Discussed reasons to return to care.  Follow-up as needed.    Subjective:  HPI:  Patient here with sore throat.  Started about 3 weeks ago.  Initially had high fevers.  Other associated symptoms include cough, fatigue, and muscle aches.  His daughter and wife have been sick with similar symptoms..  There has mostly subsided though still has persistent cough that is mostly at night.  Tried taking over-the-counter Mucinex with no improvement.       Objective:  Physical Exam: BP 115/78   Pulse 72   Temp 98.3 F (36.8 C) (Temporal)   Ht 6\' 2"  (1.88 m)   Wt 181 lb 3.2 oz (82.2 kg)   SpO2 98%   BMI 23.26 kg/m   Gen: No acute distress, resting comfortably HEENT: Bilateral tonsillar hypertrophy noted.  No lymphadenopathy. CV: Regular rate and rhythm with no murmurs appreciated Pulm: Normal work of breathing, clear to auscultation bilaterally with no crackles, wheezes, or rhonchi Neuro: Grossly normal, moves all extremities Psych: Normal affect and thought content      Nishita Isaacks M. , MD 03/20/2020 11:48 AM

## 2020-04-03 ENCOUNTER — Other Ambulatory Visit: Payer: Self-pay

## 2020-04-03 ENCOUNTER — Encounter: Payer: Self-pay | Admitting: Physician Assistant

## 2020-04-03 ENCOUNTER — Ambulatory Visit (INDEPENDENT_AMBULATORY_CARE_PROVIDER_SITE_OTHER): Payer: Self-pay | Admitting: Physician Assistant

## 2020-04-03 VITALS — BP 140/90 | HR 66 | Temp 98.5°F | Ht 74.0 in | Wt 194.2 lb

## 2020-04-03 DIAGNOSIS — R059 Cough, unspecified: Secondary | ICD-10-CM

## 2020-04-03 MED ORDER — BENZONATATE 100 MG PO CAPS: 100.0000 mg | ORAL_CAPSULE | Freq: Two times a day (BID) | ORAL | 1 refills | Status: DC | PRN

## 2020-04-03 MED ORDER — ALBUTEROL SULFATE HFA 108 (90 BASE) MCG/ACT IN AERS: 2.0000 | INHALATION_SPRAY | Freq: Four times a day (QID) | RESPIRATORY_TRACT | 0 refills | Status: DC | PRN

## 2020-04-03 MED ORDER — BUDESONIDE-FORMOTEROL FUMARATE 80-4.5 MCG/ACT IN AERO: 2.0000 | INHALATION_SPRAY | Freq: Two times a day (BID) | RESPIRATORY_TRACT | 3 refills | Status: DC

## 2020-04-03 MED ORDER — FLUTICASONE-SALMETEROL 100-50 MCG/DOSE IN AEPB: 1.0000 | INHALATION_SPRAY | Freq: Two times a day (BID) | RESPIRATORY_TRACT | 1 refills | Status: DC

## 2020-04-03 NOTE — Progress Notes (Signed)
Brandon Maldonado is a 34 y.o. male here for a follow up of a pre-existing problem.  I acted as a Neurosurgeon for [employer], PA-C Corky Mull, LPN   History of Present Illness:   Chief Complaint  Patient presents with  . Cough    HPI   Cough Patient did an in-person visit with PCP on 03/20/20. At that time he had negative strep test. Reports negative COVID test on 03/17/20. He was given oral prednisone which did not help his symptoms and also guaifenesin-codiene which he said made his symptoms worse.   His sore throat is gone.  Pt c/o persistent cough x 1 month, chest congestion. He is coughing and expectorating green sputum at times. Pt says cough is worse at night. Chest hurts when he coughs. He has been taking Rx medication but not working. Tried OTC cough medicine, Mucinex tables.   Denies: fevers, chills, poor appetite, SOB, fatigue  Wt Readings from Last 3 Encounters:  04/03/20 194 lb 4 oz (88.1 kg)  03/20/20 181 lb 3.2 oz (82.2 kg)  12/14/19 195 lb (88.5 kg)       Past Medical History:  Diagnosis Date  . Esophageal reflux   . Migraine   . Other bursitis disorders   . 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 Smoking status: Never Smoker  . Smokeless tobacco: Never Used  Substance Use Topics  . Alcohol use: Yes    Alcohol/week: 2.0 - 3.0 standard drinks    Types: 2 - 3 Standard drinks or equivalent per week    Comment: 1 if any occasionally drinker  . Drug use: No    Past Surgical History:  Procedure Laterality Date  . BACK SURGERY  2008  . CHEST SURGERY  2008   moved muscle  . SHOULDER SURGERY Right 2009   LABRAL TEAR  . SHOULDER SURGERY Left 2008  . TOE SURGERY Left 2010   4th toe, removed cyst    Family History  Problem Relation Age of Onset  . Vitamin D deficiency Mother   . Cancer - Lung Maternal Grandmother   . Cancer Maternal Grandmother        Breast  . Leukemia  Maternal Grandfather   . Cancer Maternal Grandfather   . Hyperlipidemia Maternal Grandfather   . Lung cancer Paternal Grandmother   . Cancer Paternal Grandmother     Allergies  Allergen Reactions  . Amoxicillin Rash    Current Medications:   Current Outpatient Medications:  .  dextromethorphan-guaiFENesin (MUCINEX DM) 30-600 MG 12hr tablet, Take 1 tablet by mouth 2 (two) times daily., Disp: , Rfl:  .  guaiFENesin-codeine 100-10 MG/5ML syrup, Take 5 mLs by mouth 3 (three) times daily as needed for cough., Disp: 120 mL, Rfl: 0 .  albuterol (VENTOLIN HFA) 108 (90 Base) MCG/ACT inhaler, Inhale 2 puffs into the lungs every 6 (six) hours as needed for wheezing or shortness of breath., Disp: 8 g, Rfl: 0 .  benzonatate (TESSALON) 100 MG capsule, Take 1 capsule (100 mg total) by mouth 2 (two) times daily as needed for cough., Disp: 30 capsule, Rfl: 1 .  Fluticasone-Salmeterol (WIXELA INHUB) 100-50 MCG/DOSE AEPB, Inhale 1 puff into the lungs 2 (two) times daily., Disp: 60 each, Rfl: 1   Review of Systems:   ROS Negative unless otherwise specified per HPI.  Vitals:   Vitals:   04/03/20 1025  BP: 140/90  Pulse: 66  Temp: 98.5 F (36.9 C)  TempSrc: Temporal  SpO2: 98%  Weight: 194 lb 4 oz (88.1 kg)  Height: 6\' 2"  (1.88 m)     Body mass index is 24.94 kg/m.  Physical Exam:   Physical Exam Vitals and nursing note reviewed.  Constitutional:      General: He is not in acute distress.    Appearance: He is well-developed. He is not ill-appearing, toxic-appearing or sickly-appearing.  HENT:     Head: Normocephalic and atraumatic.     Right Ear: Tympanic membrane, ear canal and external ear normal. Tympanic membrane is not erythematous, retracted or bulging.     Left Ear: Tympanic membrane, ear canal and external ear normal. Tympanic membrane is not erythematous, retracted or bulging.     Nose: Nose normal.     Right Sinus: No maxillary sinus tenderness or frontal sinus tenderness.      Left Sinus: No maxillary sinus tenderness or frontal sinus tenderness.     Mouth/Throat:     Pharynx: Uvula midline. No posterior oropharyngeal edema or posterior oropharyngeal erythema.  Eyes:     General: Lids are normal.     Conjunctiva/sclera: Conjunctivae normal.  Neck:     Trachea: Trachea normal.  Cardiovascular:     Rate and Rhythm: Normal rate and regular rhythm.     Heart sounds: Normal heart sounds, S1 normal and S2 normal.  Pulmonary:     Effort: Pulmonary effort is normal.     Breath sounds: Normal breath sounds. No decreased breath sounds, wheezing, rhonchi or rales.  Lymphadenopathy:     Cervical: No cervical adenopathy.  Skin:    General: Skin is warm, dry and intact.  Neurological:     Mental Status: He is alert.  Psychiatric:        Mood and Affect: Mood and affect normal.        Speech: Speech normal.        Behavior: Behavior normal. Behavior is cooperative.     Assessment and Plan:   Jaziah was seen today for cough.  Diagnoses and all orders for this visit:  Cough No red flags on exam.  Will initiate generic advair (wixela) BID (if able to afford) vs starting albuterol prn per orders. Trial tessalon perles as well. Discussed taking medications as prescribed. Reviewed return precautions including worsening fever, SOB, worsening cough or other concerns. Push fluids and rest. I recommend that patient follow-up if symptoms worsen or persist despite treatment x 7-10 days, sooner if needed.  Consider chest xray if symptoms worsen or persist.  Other orders -     Discontinue: budesonide-formoterol (SYMBICORT) 80-4.5 MCG/ACT inhaler; Inhale 2 puffs into the lungs 2 (two) times daily. -     benzonatate (TESSALON) 100 MG capsule; Take 1 capsule (100 mg total) by mouth 2 (two) times daily as needed for cough. -     Fluticasone-Salmeterol (WIXELA INHUB) 100-50 MCG/DOSE AEPB; Inhale 1 puff into the lungs 2 (two) times daily. -     albuterol (VENTOLIN HFA) 108  (90 Base) MCG/ACT inhaler; Inhale 2 puffs into the lungs every 6 (six) hours as needed for wheezing or shortness of breath.   CMA or LPN served as scribe during this visit. History, Physical, and Plan performed by medical provider. The above documentation has been reviewed and is accurate and complete.  Apolinar Junes, PA-C

## 2020-04-03 NOTE — Patient Instructions (Addendum)
It was great to see you!  Trial Wixela inhaler. This is generic advair. It will cost around $90 at [employer] per [facility].  Another option if this is unaffordable, is just to trial a rescue inhaler that you can use every 4 to 6 hours. This does not contain a steroid like the Wixela inhaler but still may be effective. It will cost around $28 at [facility] per [facility].  Trial tessalon capsules, these have been sent to [facility].  If no improvement or worsening, let's get an xray. Please let us know.  Take care,  Jarold Motto PA-C

## 2020-12-10 IMAGING — MR MR FOOT*L* W/O CM
4 of 5 series · 20 of 40 positions shown · non-contrast
Comparison: Left great toe x-rays dated March 27, 2018.

CLINICAL DATA: Persistent pain in the first webspace with decreased
extension of the great toe since a small piece of equipment ran over
his foot at work in [REDACTED].

EXAM:
MRI OF THE LEFT FOOT WITHOUT CONTRAST
TECHNIQUE: Multiplanar, multisequence MR imaging of the left forefoot was
performed. No intravenous contrast was administered.

[Series 4: T1 · coronal · 4.0mm · 0.38mm/px · 3 of 37 slices shown (1 of 2)]
[im 5/37]
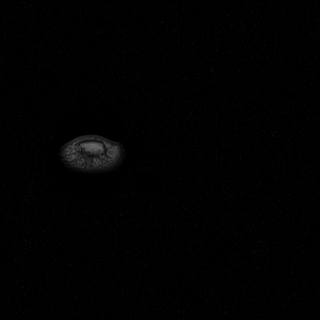
[im 21/37]
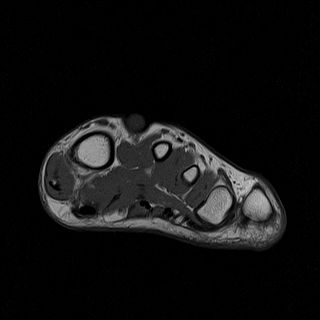
[im 33/37]
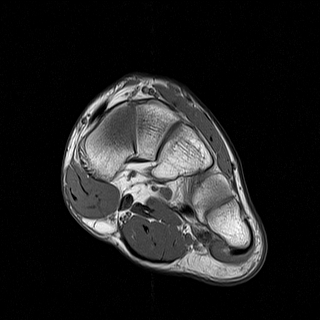

[Series 5: T2 fat-sat · coronal · 4.0mm · 0.23mm/px · 9 of 37 slices shown (1 of 2)]
[im 1/37]
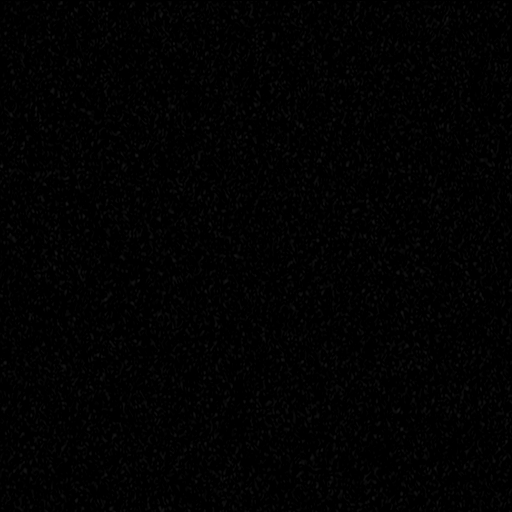
[im 5/37]
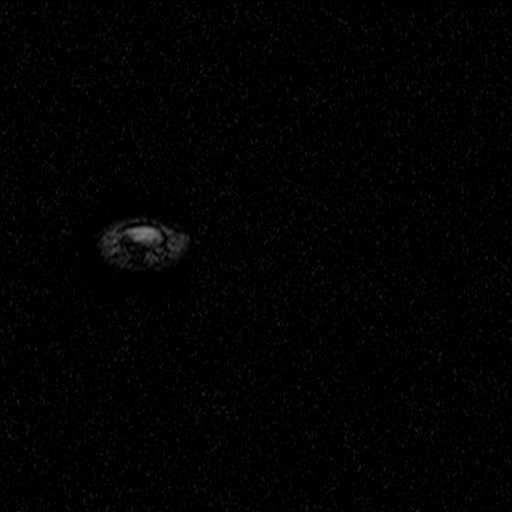
[im 10/37]
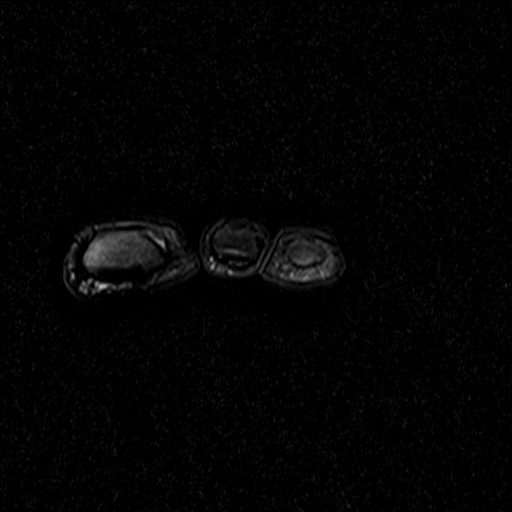
[im 14/37]
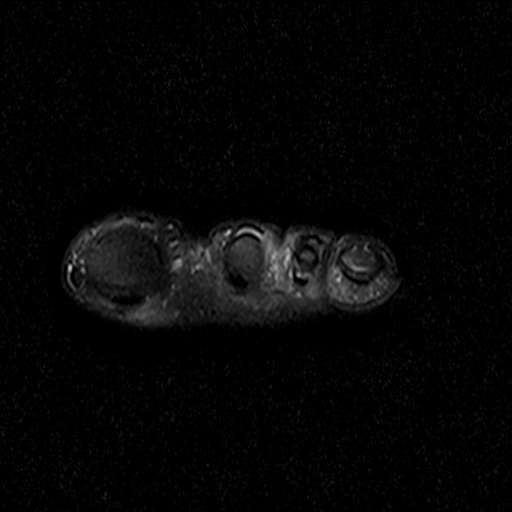
[im 19/37]
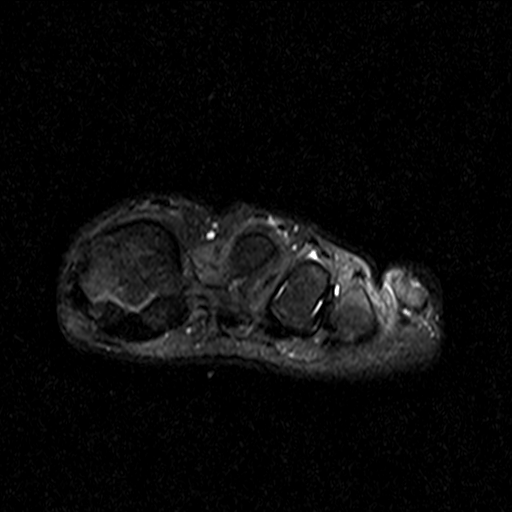
[im 23/37]
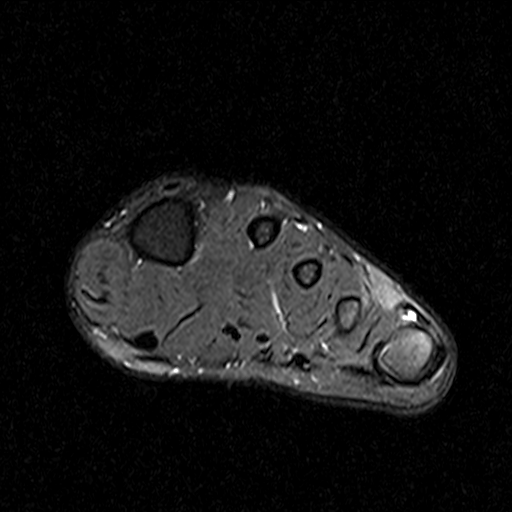
[im 28/37]
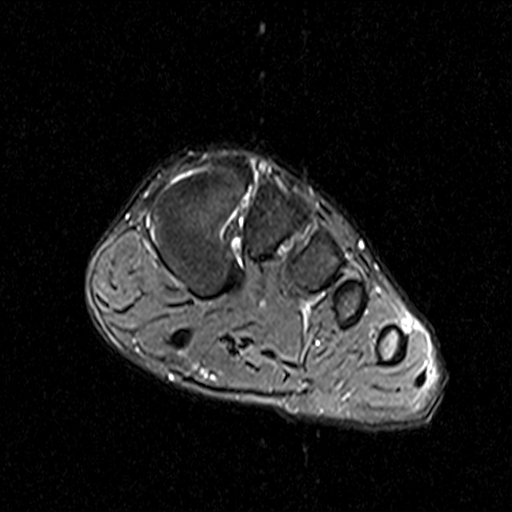
[im 32/37]
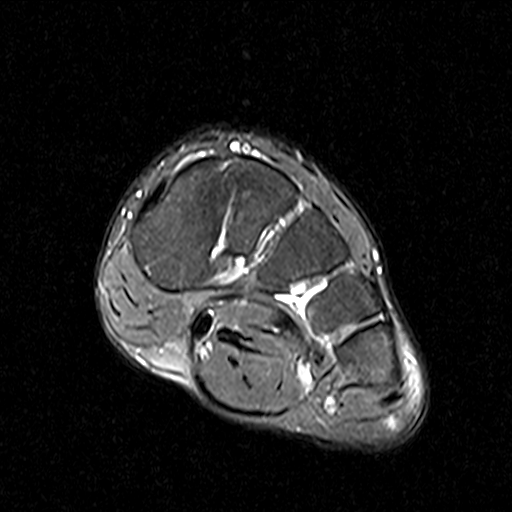
[im 37/37]
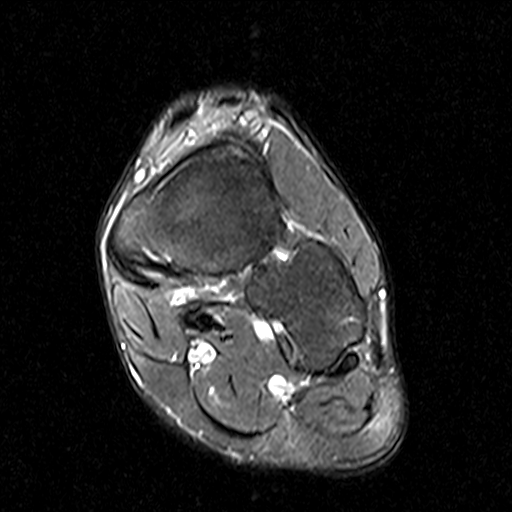

[Series 6: T1 · axial · 2.5mm · 0.30mm/px · z∈[-34,+9]mm · 3 of 28 slices shown (2 of 2)]
[im 5/28]
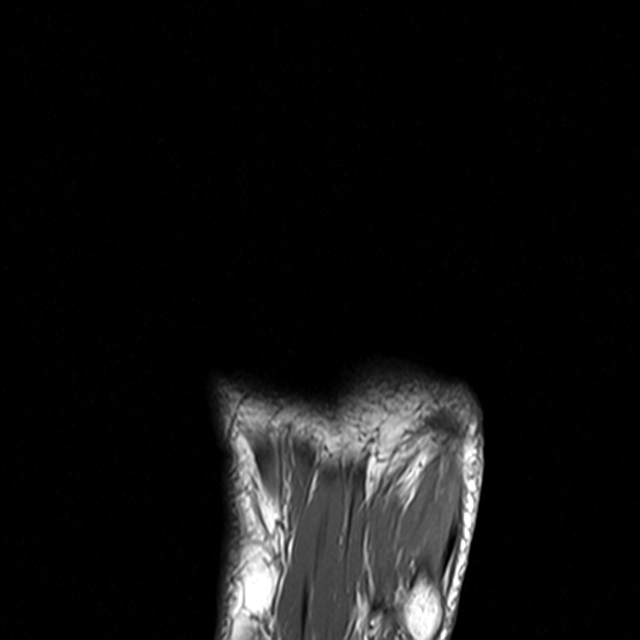
[im 14/28]
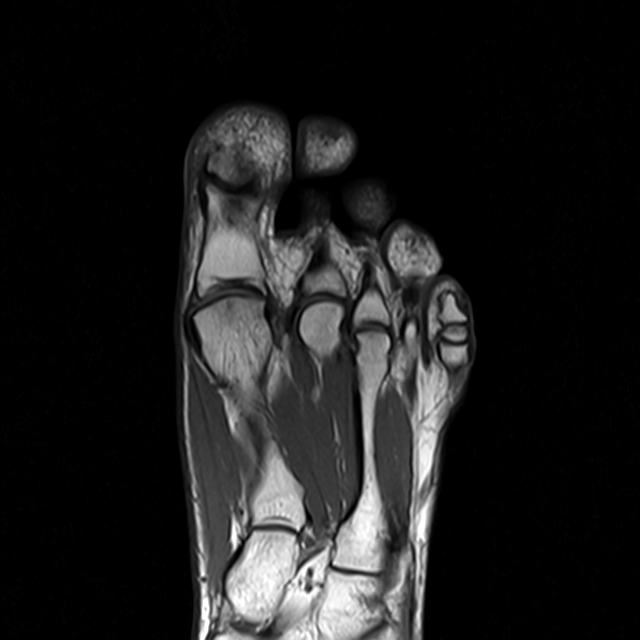
[im 23/28]
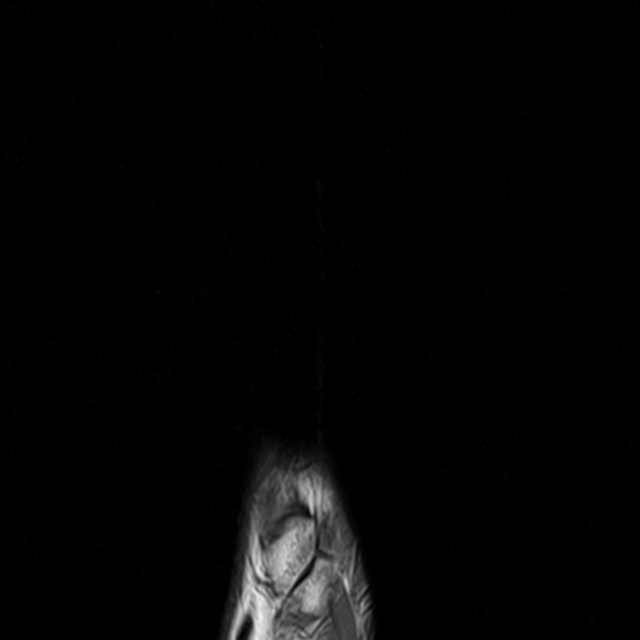

[Series 7: T2 fat-sat · axial · 2.5mm · 0.37mm/px · z∈[-40,+13]mm · 5 of 28 slices shown (2 of 2)]
[im 1/28]
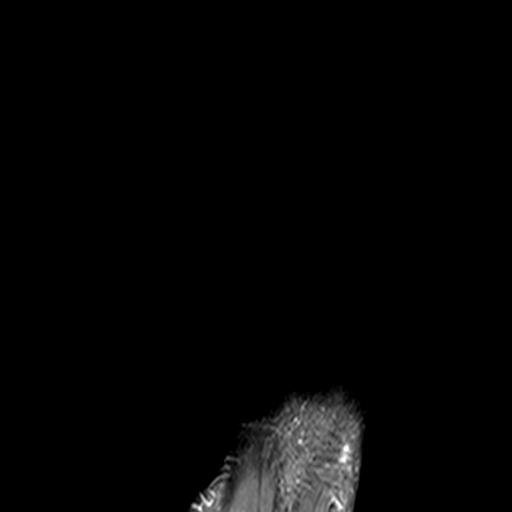
[im 5/28]
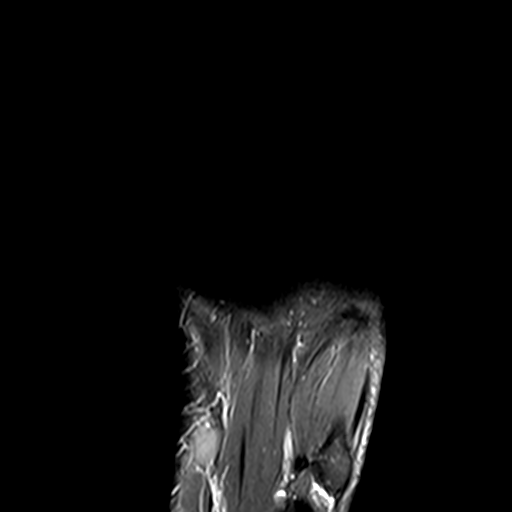
[im 10/28]
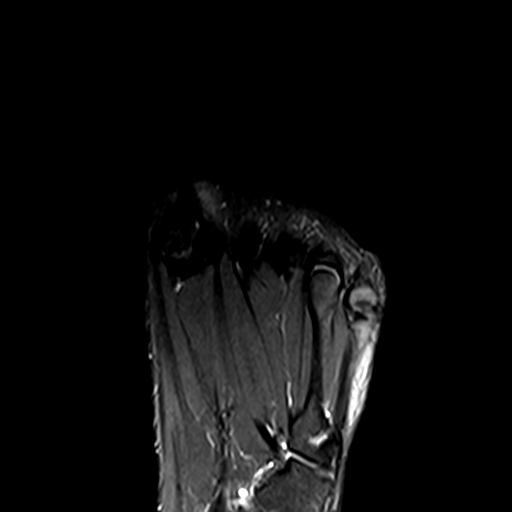
[im 14/28]
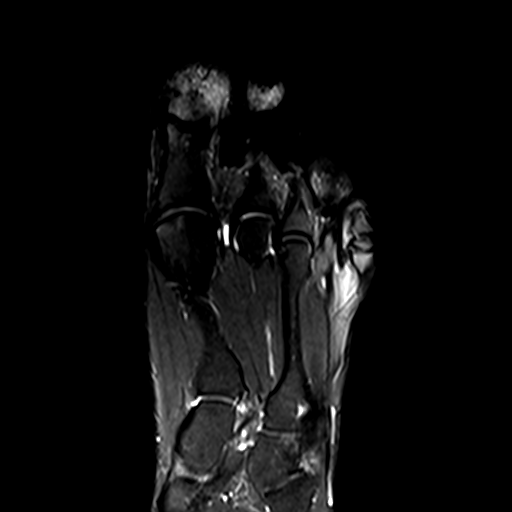
[im 23/28]
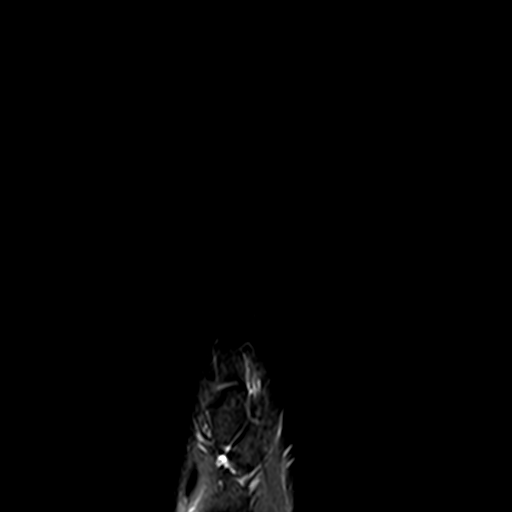

[20 of 40 positions shown; findings below may reference images not displayed]

FINDINGS: Bones/Joint/Cartilage

Focal subchondral marrow edema/cystic change the central first
metatarsal head. No fracture or dislocation. Normal alignment. No
joint effusion. Small amount of fluid in the first intermetatarsal
bursa.

Ligaments

Collateral ligaments are intact. Lisfranc ligament is intact. The
plantar plate is intact.

Muscles and Tendons
Flexor, peroneal and extensor compartment tendons are intact. No
muscle edema or atrophy.

Soft tissue
No fluid collection or hematoma.  No soft tissue mass.
IMPRESSION: 1.  No acute abnormality.
2. Early first MTP joint osteoarthritis.
3. Mild first intermetatarsal bursitis.

## 2021-01-18 ENCOUNTER — Encounter: Payer: Self-pay | Admitting: Family

## 2021-01-18 ENCOUNTER — Ambulatory Visit (INDEPENDENT_AMBULATORY_CARE_PROVIDER_SITE_OTHER): Payer: Self-pay | Admitting: Family

## 2021-01-18 ENCOUNTER — Other Ambulatory Visit: Payer: Self-pay

## 2021-01-18 DIAGNOSIS — M25512 Pain in left shoulder: Secondary | ICD-10-CM | POA: Insufficient documentation

## 2021-01-18 HISTORY — DX: Pain in left shoulder: M25.512

## 2021-01-18 NOTE — Progress Notes (Signed)
Acute Office Visit  Subjective:    Patient ID: Brandon Maldonado, male    DOB: [birth year], 35 y.o.   MRN: [id number]  Chief Complaint  Patient presents with   Shoulder Pain    Left shoulder; Pt says 3 weeks ago he slept on the couch, he had a crook in his neck that led to this pain. He says that it has been very debilitating. He has massaged it, taken OTC Aleve and used ice/heat packs. He complains of constant pain.     Shoulder Pain   Patient is in today for left shoulder pain for 3 weeks, continuous sharp, stabbing pain. 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 Complains of most of pain today around deltoid area, started in trapezius then moved down to lateral shoulder.   Past Surgical History:  Procedure Laterality Date   BACK SURGERY  2008   CHEST SURGERY  2008   moved muscle   SHOULDER SURGERY Right 2009   LABRAL TEAR   SHOULDER SURGERY Left 2008   TOE SURGERY Left 2010   4th toe, removed cyst     Outpatient Medications Prior to Visit  Medication Sig Dispense Refill   albuterol (VENTOLIN HFA) 108 (90 Base) MCG/ACT inhaler Inhale 2 puffs into the lungs every 6 (six) hours as needed for wheezing or shortness of breath. (Patient not taking: Reported on 01/18/2021) 8 g 0   benzonatate (TESSALON) 100 MG capsule Take 1 capsule (100 mg total) by mouth 2 (two) times daily as needed for cough. (Patient not taking: Reported on 01/18/2021) 30 capsule 1   dextromethorphan-guaiFENesin (MUCINEX DM) 30-600 MG 12hr tablet Take 1 tablet by mouth 2 (two) times daily. (Patient not taking: Reported on 01/18/2021)     Fluticasone-Salmeterol (WIXELA INHUB) 100-50 MCG/DOSE AEPB Inhale 1 puff into the lungs 2 (two) times daily. (Patient not taking: Reported on 01/18/2021) 60 each 1   guaiFENesin-codeine 100-10 MG/5ML syrup Take 5 mLs by mouth 3 (three) times daily as needed  for cough. (Patient not taking: Reported on 01/18/2021) 120 mL 0   No facility-administered medications prior to visit.    Allergies  Allergen Reactions   Amoxicillin Rash    Review of Systems See HPI above.     Objective:    Physical Exam Vitals and nursing note reviewed.  Constitutional:      Appearance: Normal appearance.  Cardiovascular:     Rate and Rhythm: Normal rate and regular rhythm.  Pulmonary:     Effort: Pulmonary effort is normal.     Breath sounds: Normal breath sounds.  Musculoskeletal:     Left shoulder: Tenderness (sharp, stabbing pain deep in anterior shoulder region and deltoid) present. No swelling or effusion. Normal range of motion. Decreased strength.  Neurological:     Mental Status: He is alert and oriented to person, place, and time.  Psychiatric:        Mood and Affect: Mood normal.    BP (!) 136/92   Pulse 65   Temp 97.9 F (36.6 C) (Temporal)   Ht 6\' 2"  (1.88 m)   Wt 208 lb (94.3 kg)   SpO2 97%   BMI 26.71 kg/m  Wt Readings from Last 3 Encounters:  01/18/21 208 lb (94.3 kg)  04/03/20 194 lb 4 oz (88.1 kg)  03/20/20 181 lb 3.2 oz (82.2 kg)       Assessment & Plan:  Problem List Items Addressed This Visit       Other   Left shoulder pain    Pt reports using Aleve bid which helps relieve the pain slightly, and ice/heat alternatively. 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 Will refer to [facility].      Relevant Orders   Ambulatory referral to Orthopedic Surgery     No orders of the defined types were placed in this encounter.    Dulce Sellar, NP

## 2021-01-18 NOTE — Patient Instructions (Signed)
A referral has been ordered for you to see a [facility] office. Continue to take generic Aleve, 2 tabs twice a day as needed after eating. Continue to alternate ice/heat for 20 min at a time 3 times/day as able. Call the office if you have not heard from the Ortho office within 10days.

## 2021-01-18 NOTE — Assessment & Plan Note (Addendum)
Pt reports using Aleve bid which helps relieve the pain slightly, and ice/heat alternatively. 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 Will refer to [facility].

## 2021-01-21 ENCOUNTER — Other Ambulatory Visit (HOSPITAL_BASED_OUTPATIENT_CLINIC_OR_DEPARTMENT_OTHER): Payer: Self-pay | Admitting: Orthopaedic Surgery

## 2021-01-21 DIAGNOSIS — M25512 Pain in left shoulder: Secondary | ICD-10-CM

## 2021-01-22 ENCOUNTER — Other Ambulatory Visit: Payer: Self-pay

## 2021-01-22 ENCOUNTER — Ambulatory Visit (INDEPENDENT_AMBULATORY_CARE_PROVIDER_SITE_OTHER): Payer: Self-pay | Admitting: Orthopaedic Surgery

## 2021-01-22 ENCOUNTER — Ambulatory Visit (HOSPITAL_BASED_OUTPATIENT_CLINIC_OR_DEPARTMENT_OTHER)
Admission: RE | Admit: 2021-01-22 | Discharge: 2021-01-22 | Disposition: A | Discharge status: Home / Self Care | Payer: Self-pay | Source: Ambulatory Visit | Attending: Orthopaedic Surgery | Admitting: Orthopaedic Surgery

## 2021-01-22 VITALS — Ht 74.0 in | Wt 205.0 lb

## 2021-01-22 DIAGNOSIS — M899 Disorder of bone, unspecified: Secondary | ICD-10-CM

## 2021-01-22 DIAGNOSIS — M7552 Bursitis of left shoulder: Secondary | ICD-10-CM

## 2021-01-22 DIAGNOSIS — M25512 Pain in left shoulder: Secondary | ICD-10-CM

## 2021-01-22 NOTE — Progress Notes (Signed)
Chief Complaint: left shoulder pain     History of Present Illness:   Pain Score: 7/10 SANE: 85/100  Brandon Maldonado is a 35 y.o. male RHD with left shoulder pain going on now for over a month.  He states that initially he was experiencing upper trapezial type pain but now this is migrating to the lateral aspect of the shoulder.  He believes his low he slept on the neck wrong.  Of note he does have a history of [place] syndrome for which they performed a latissimus transfer to his chest wall.  This was done when he was 19.  Overall he had multiple years of good relief but his shoulder has been more symptomatic recently.  He takes Aleve which helps.  He works as a Emergency planning/management officer in [employer] but does not need to do heavy lifting as part of the job.  He denies any weakness predominantly soreness.  He does have pain that is worse at night.  He has not had any injections in the shoulder but does endorse injections into the upper neck area.  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0 standard drinks    Types: 2 - 3 Standard drinks or equivalent per week    Comment: 1 if any occasionally drinker   Drug use: No   Sexual activity: Yes  Other Topics Concern   Not on file  Social History Narrative   Patient is married, with no children.   Patient is right handed.   Patient has a high school graduate, and has some college education.   Patient drinks 1 cup of coffee daily.   Social Determinants of Health   Financial Resource Strain: Not on file  Food Insecurity: Not on file  Transportation Needs: Not on file  Physical Activity: Not on file  Stress: Not on file  Social Connections: Not on file   Family History  Problem Relation Age of Onset   Vitamin D deficiency Mother    Cancer - Lung Maternal Grandmother    Cancer Maternal Grandmother        Breast   Leukemia Maternal Grandfather    Cancer Maternal Grandfather    Hyperlipidemia Maternal Grandfather  Lung cancer Paternal Grandmother    Cancer Paternal Grandmother    Allergies  Allergen Reactions   Amoxicillin Rash   No current outpatient medications on file.   No current facility-administered medications for this visit.   No results found.  Review of Systems:   A ROS was performed including pertinent positives and negatives as documented in the HPI.  Physical Exam :   Constitutional: NAD and appears stated age Neurological: Alert and oriented Psych: Appropriate affect and cooperative Height 6\' 2"  (1.88 m), weight 205 lb (93 kg).   Comprehensive Musculoskeletal Exam:    Musculoskeletal Exam    Inspection Right Left  Skin No atrophy or winging No atrophy or winging  Palpation    Tenderness None Lateral deltoid  Range of Motion    Flexion (passive) 170 170  Flexion (active) 170 170  Abduction 170 170  ER at the side 70 70  Can reach behind back to T12 T12  Strength     Full Full  Special Tests    Pseudoparalytic No No  Neurologic    Fires  PIN, radial, median, ulnar, musculocutaneous, axillary, suprascapular, long thoracic, and spinal accessory innervated muscles. No abnormal sensibility  Vascular/Lymphatic    Radial Pulse 2+ 2+  Cervical Exam    Patient has symmetric cervical range of motion with negative Spurling's test.  Special Test: Positive Spurling   He has significant medial scapular winging with forward elevation of the left shoulder  Imaging:   Xray (left shoulder 3 views): Normal  I personally reviewed and interpreted the radiographs.   Assessment:   35 year old male with left shoulder pain consistent with subacromial bursitis in the setting of scapular dyskinesis.  He also has significant pain in his trapezius which is again consistent with strain in the setting of neck abnormal acromial movement.  I described that I believe the majority of her bursitis is coming from the scapular movement.  At this point I would recommend physical therapy for a rhomboid lower trapezius strengthening program so as to allow his shoulder to move in a more optimal way and ultimately prevent bursitis in the future.  I also described that I can perform a subacromial injection but the predominant use for this would be to improve his subacromial bursitis and allow him to work through physical therapy.  Plan :    -Physical therapy left shoulder for rhomboid lower trapezius strengthening as well as possible trapezial needling -Return in 4 weeks if no improvement or if he would like a subacromial injection     I personally saw and evaluated the patient, and participated in the management and treatment plan.  34, MD Attending Physician, Orthopedic Surgery  This document was dictated using Dragon voice recognition software. A reasonable attempt at proof reading has been made to minimize errors.

## 2021-03-23 ENCOUNTER — Ambulatory Visit (INDEPENDENT_AMBULATORY_CARE_PROVIDER_SITE_OTHER): Payer: Self-pay | Admitting: Family Medicine

## 2021-03-23 ENCOUNTER — Encounter: Payer: Self-pay | Admitting: Family Medicine

## 2021-03-23 ENCOUNTER — Other Ambulatory Visit: Payer: Self-pay

## 2021-03-23 VITALS — BP 140/88 | HR 89 | Temp 98.2°F | Ht 74.0 in | Wt 207.2 lb

## 2021-03-23 DIAGNOSIS — R051 Acute cough: Secondary | ICD-10-CM

## 2021-03-23 MED ORDER — BENZONATATE 100 MG PO CAPS: 100.0000 mg | ORAL_CAPSULE | Freq: Three times a day (TID) | ORAL | 0 refills | Status: DC | PRN

## 2021-03-23 NOTE — Patient Instructions (Signed)
I am sending your tessalon perles to [facility] If worse, etc, let me know

## 2021-03-23 NOTE — Progress Notes (Signed)
Established Patient Office Visit  Subjective:  Patient ID: Brandon Maldonado, male    DOB: 1985-04-24  Age: 35 y.o. MRN: [id number]  CC:  Chief Complaint  Patient presents with   Cough    Still has productive cough, recovering from the flu 2 days after Thanksgiving     HPI [name] presents for cough. Mom and brother both tested + for flu.  9 of 13 family members got flu. Pt started showing symptoms night after Thanksgiving 11/25. Had high fever, congestion, HA, cough, body aches, chills.  Hard to breathe, but better now. Waking up coughing and can throw up from cough  Here as cough continues.  Usu when sick, cough lingers and wakes him up. Minimal congestion. Nonsmoker. No h/o asthma/pneumonia.   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    Past Surgical History:  Procedure Laterality Date   BACK SURGERY  2008   CHEST SURGERY  2008   moved muscle   SHOULDER SURGERY Right 2009   LABRAL TEAR   SHOULDER SURGERY Left 2008   TOE SURGERY Left 2010   4th toe, removed cyst    Family History  Problem Relation Age of Onset   Vitamin D deficiency Mother    Cancer - Lung Maternal Grandmother    Cancer Maternal Grandmother        Breast   Leukemia Maternal Grandfather    Cancer Maternal Grandfather    Hyperlipidemia Maternal Grandfather    Lung cancer Paternal Grandmother    Cancer Paternal Grandmother     Social History   Socioeconomic History   Marital status: Married    Spouse name: Not on file   Number of children: 0   Years of education: Not on file   Highest education level: Not on file  Occupational History   Not on file  Tobacco Use   Smoking status: Never   Smokeless tobacco: Never  Substance and Sexual Activity   Alcohol use: Yes    Alcohol/week: 2.0 - 3.0 standard drinks    Types: 2 - 3 Standard drinks  or equivalent per week    Comment: 1 if any occasionally drinker   Drug use: No   Sexual activity: Yes  Other Topics Concern   Not on file  Social History Narrative   Patient is married, with no children.   Patient is right handed.   Patient has a high school graduate, and has some college education.   Patient drinks 1 cup of coffee daily.   Social Determinants of Health   Financial Resource Strain: Not on file  Food Insecurity: Not on file  Transportation Needs: Not on file  Physical Activity: Not on file  Stress: Not on file  Social Connections: Not on file  Intimate Partner Violence: Not on file    No outpatient medications prior to visit.   No facility-administered medications prior to visit.    Allergies  Allergen Reactions   Amoxicillin Rash    ROS Review of Systems    Objective:    Physical Exam Gen: WDWN NAD HEENT: NCAT, conjunctiva not injected, sclera nonicteric. TM WNL B.  OP normal x L tonsil larger than R NECK:  supple, no thyromegaly, no nodes, no carotid bruits CARDIAC: RRR, S1S2+, no murmur. DP 2+B LUNGS: CTAB. No wheezes  NEURO: A&O x3.  CN II-XII intact.  PSYCH: normal mood. Good eye contact   BP 140/88   Pulse 89   Temp 98.2 F (36.8 C) (Temporal)   Ht 6\' 2"  (1.88 m)   Wt 207 lb 4 oz (94 kg)   SpO2 98%   BMI 26.61 kg/m  Wt Readings from Last 3 Encounters:  03/23/21 207 lb 4 oz (94 kg)  01/22/21 205 lb (93 kg)  01/18/21 208 lb (94.3 kg)     Health Maintenance Due  Topic Date Due   HIV Screening  Never done   Hepatitis C Screening  Never done    There are no preventive care reminders to display for this patient.     Assessment & Plan:   Problem List Items Addressed This Visit   None Visit Diagnoses     Acute cough    -  Primary     Post viral.  Discussed course of dz.  Will do tessalon perles.  Warning signs discussed.  Worse, not improving in 1 wk, will add abx.  Meds ordered this encounter  Medications    benzonatate (TESSALON PERLES) 100 MG capsule    Sig: Take 1 capsule (100 mg total) by mouth 3 (three) times daily as needed for cough.    Dispense:  30 capsule    Refill:  0     Follow-up: Return if symptoms worsen or fail to improve.    03/20/21., MD

## 2022-01-07 ENCOUNTER — Encounter: Payer: Self-pay | Admitting: *Deleted

## 2022-02-05 ENCOUNTER — Ambulatory Visit (INDEPENDENT_AMBULATORY_CARE_PROVIDER_SITE_OTHER): Payer: Self-pay | Admitting: Family Medicine

## 2022-02-05 ENCOUNTER — Encounter: Payer: Self-pay | Admitting: Family Medicine

## 2022-02-05 VITALS — BP 136/89 | HR 77 | Temp 97.5°F | Ht 74.0 in | Wt 204.8 lb

## 2022-02-05 DIAGNOSIS — G43809 Other migraine, not intractable, without status migrainosus: Secondary | ICD-10-CM

## 2022-02-05 DIAGNOSIS — K219 Gastro-esophageal reflux disease without esophagitis: Secondary | ICD-10-CM

## 2022-02-05 DIAGNOSIS — Z23 Encounter for immunization: Secondary | ICD-10-CM

## 2022-02-05 MED ORDER — SUMATRIPTAN SUCCINATE 50 MG PO TABS: 50.0000 mg | ORAL_TABLET | ORAL | 0 refills | Status: AC | PRN

## 2022-02-05 MED ORDER — AMITRIPTYLINE HCL 25 MG PO TABS: 25.0000 mg | ORAL_TABLET | Freq: Every day | ORAL | 3 refills | Status: DC

## 2022-02-05 NOTE — Assessment & Plan Note (Signed)
No red flags.  Reassuring neuro exam today.  We discussed starting medication versus referral to neurology.  He would like to start medications.  He has had modest improvement with medications in the past however does not remember which medications he has been on the past.  We will start low-dose amitriptyline 25 mg nightly also start Imitrex 50 mg as needed.  He will follow-up in a couple weeks via MyChart and we can titrate the dose of amitriptyline as needed.  May consider referral to neurology if symptoms do not improve with this regimen.  We also discussed riboflavin and magnesium supplementation.

## 2022-02-05 NOTE — Assessment & Plan Note (Signed)
Not having any overt symptoms of reflux though could be contributing to above cough.  Recommend starting Pepcid before bed as above.

## 2022-02-05 NOTE — Patient Instructions (Addendum)
It was very nice to see you today!  We will start amitryptyline nightly to help prevent migraines. You can use the imitrex as needed for severe migraines.  You can also try supplementing with riboflavin and B2.  Please try taking pepcid and an allergy medication at night to help with the cough.  Please check in with [place] in a few weeks on mychart.   Take care,  PLEASE NOTE:  If you had any lab tests please let us know if you have not heard back within a few days. You may see your results on mychart before we have a chance to review them but we will give you a call once they are reviewed by [place]. If we ordered any referrals today, please let us know if you have not heard from their office within the next week.   Please try these tips to maintain a healthy lifestyle:  Eat at least 3 REAL meals and 1-2 snacks per day.  Aim for no more than 5 hours between eating.  If you eat breakfast, please do so within one hour of getting up.   Each meal should contain half fruits/vegetables, one quarter protein, and one quarter carbs (no bigger than a computer mouse)  Cut down on sweet beverages. This includes juice, soda, and sweet tea.   Drink at least 1 glass of water with each meal and aim for at least 8 glasses per day  Exercise at least 150 minutes every week.

## 2022-02-05 NOTE — Progress Notes (Signed)
   Brandon Maldonado is a 36 y.o. male who presents today for an office visit.  Assessment/Plan:  New/Acute Problems: Cough No red flags. Likely postnasal drip though may have some underlying GERD as well. Reassuring exam today. Recommended that he start over-the-counter allergy meds such as Claritin or Zyrtec.  He can also try using Pepcid before bed.  He will let us know if not improving.  Chronic Problems Addressed Today: GERD (gastroesophageal reflux disease) Not having any overt symptoms of reflux though could be contributing to above cough.  Recommend starting Pepcid before bed as above.   Migraine No red flags.  Reassuring neuro exam today.  We discussed starting medication versus referral to neurology.  He would like to start medications.  He has had modest improvement with medications in the past however does not remember which medications he has been on the past.  We will start low-dose amitriptyline 25 mg nightly also start Imitrex 50 mg as needed.  He will follow-up in a couple weeks via MyChart and we can titrate the dose of amitriptyline as needed.  May consider referral to neurology if symptoms do not improve with this regimen.  We also discussed riboflavin and magnesium supplementation.  Flu shot given today.     Subjective:  HPI:  See A/p for status of chronic conditions.    He has been having a lot of cough in the morning. Feels like he a lot of saliva production overnight due wearing a mouth guard. No post nasal drip. No chest pain or shorteness of breath. No fevers or chills. Does not feel like reflux has been a significant issue recently.  He has also been having frequent headaches recently. He does have a history of migraines. They usually comes in cluster. He has had multiple weekends in a row of significant migraines. Usually migraines are located around his left eye but recently has started having more migraines in the back of his head. Pain is characteristic of  his typical migraine. Has tried using ice packs.   He has been on preventative medications in the past without signiificant improvement.        Objective:  Physical Exam: BP 136/89   Pulse 77   Temp (!) 97.5 F (36.4 C) (Temporal)   Ht 6\' 2"  (1.88 m)   Wt 204 lb 12.8 oz (92.9 kg)   SpO2 100%   BMI 26.29 kg/m   Gen: No acute distress, resting comfortably CV: Regular rate and rhythm with no murmurs appreciated Pulm: Normal work of breathing, clear to auscultation bilaterally with no crackles, wheezes, or rhonchi Neuro: CN2-12 intact. Strength 5/5 in all extremities. Psych: Normal affect and thought content      Dwon Sky M. Jerline Pain, MD 02/05/2022 9:14 AM

## 2022-08-23 ENCOUNTER — Encounter: Payer: Self-pay | Admitting: Family Medicine

## 2022-08-23 ENCOUNTER — Ambulatory Visit (INDEPENDENT_AMBULATORY_CARE_PROVIDER_SITE_OTHER): Payer: Self-pay | Admitting: Family Medicine

## 2022-08-23 VITALS — BP 148/88 | HR 89 | Temp 97.1°F | Ht 75.0 in | Wt 210.4 lb

## 2022-08-23 DIAGNOSIS — R030 Elevated blood-pressure reading, without diagnosis of hypertension: Secondary | ICD-10-CM

## 2022-08-23 DIAGNOSIS — R002 Palpitations: Secondary | ICD-10-CM

## 2022-08-23 DIAGNOSIS — D229 Melanocytic nevi, unspecified: Secondary | ICD-10-CM

## 2022-08-23 DIAGNOSIS — G4730 Sleep apnea, unspecified: Secondary | ICD-10-CM

## 2022-08-23 NOTE — Progress Notes (Signed)
   Brandon Maldonado is a 37 y.o. male who presents today for an office visit.  Assessment/Plan:  New/Acute Problems: Palpitations  History consistent with PVC. Reassuring exam today and EKG .  No red flag signs or symptoms.  Likely exacerbated by caffeine, alcohol, and sleep deprivation while on vacation.  He has not had any recurrence over the last several days.  We discussed treatment options including Holter monitor and cardiology referral versus watchful waiting.  Given that symptoms have not recurred and he has not had any other concerning signs or symptoms, would be reasonable to continue with watchful waiting for now.  He is agreeable to this plan.  We discussed reasons to return to care and seek emergent care.  Nevus No red flags.  Reassured patient.  We discussed warning signs and reasons to return to care  Elevated BP Reading Slightly above goal today though typically has been well-controlled.  He will monitor at home for the next several weeks and let us know if persistently elevated.  Sleep disordered breathing Wife has ordered about home sleep study.  He will let us know the results once this comes back.     Subjective:  HPI:  See Assessment / plan for status of chronic conditions. He is here today with palpitations and concern for mole on his leg.  The palpitation happened a few days ago [place]. Felt a sudden jolt in chest. This lasted for a moment and then subsided. Happened  about 4-5 times in row. Happened right after drinking coffee. He does have coffee daily and has never had any issues with this in the past.  No nausea or vomiting. No lightheadedness or syncope. No weakness or numbness with the episodes. He also notes the did have more alcohol than usual the day before.   He also has a mole on his left leg.  This has been present for years.  No significant change recently.       Objective:  Physical Exam: Ht 6' (1.829 m)   BMI  27.78 kg/m   Gen: No acute distress, resting comfortably CV: Regular rate and rhythm with no murmurs appreciated Pulm: Normal work of breathing, clear to auscultation bilaterally with no crackles, wheezes, or rhonchi Skin: Well-circumscribed 3 to 4 mm nevus on left lateral leg. Neuro: Grossly normal, moves all extremities Psych: Normal affect and thought content  EKG: NSR. No ischemic changes.  No ectopic beats.  Time Spent: 30 minutes of total time was spent on the date of the encounter performing the following actions: chart review prior to seeing the patient, obtaining history, performing a medically necessary exam, counseling on the treatment plan, placing orders, and documenting in our EHR.  This does not include time spent interpreting above EKG.       Katina Degree. Jimmey Ralph, MD 08/23/2022 1:28 PM

## 2022-08-23 NOTE — Patient Instructions (Addendum)
It was very nice to see you today!  You probably had a premature beat called a PVC.  Your EKG today is normal.  Please let us know if your symptoms return and we can refer you to a cardiologist or have you set up for heart monitoring.  The mole on your leg is benign.  Please let us know if it changes.  Please come back soon for annual physical  Return if symptoms worsen or fail to improve.   Take care, Dr Jimmey Ralph  PLEASE NOTE:  If you had any lab tests, please let us know if you have not heard back within a few days. You may see your results on mychart before we have a chance to review them but we will give you a call once they are reviewed by [place].   If we ordered any referrals today, please let us know if you have not heard from their office within the next week.   If you had any urgent prescriptions sent in today, please check with the pharmacy within an hour of our visit to make sure the prescription was transmitted appropriately.   Please try these tips to maintain a healthy lifestyle:  Eat at least 3 REAL meals and 1-2 snacks per day.  Aim for no more than 5 hours between eating.  If you eat breakfast, please do so within one hour of getting up.   Each meal should contain half fruits/vegetables, one quarter protein, and one quarter carbs (no bigger than a computer mouse)  Cut down on sweet beverages. This includes juice, soda, and sweet tea.   Drink at least 1 glass of water with each meal and aim for at least 8 glasses per day  Exercise at least 150 minutes every week.

## 2022-12-14 ENCOUNTER — Emergency Department (HOSPITAL_COMMUNITY): Payer: Self-pay

## 2022-12-14 ENCOUNTER — Encounter (HOSPITAL_COMMUNITY): Payer: Self-pay | Admitting: *Deleted

## 2022-12-14 ENCOUNTER — Emergency Department (HOSPITAL_COMMUNITY)
Admission: EM | Admit: 2022-12-14 | Discharge: 2022-12-14 | Disposition: A | Discharge status: Home / Self Care | Payer: Self-pay

## 2022-12-14 ENCOUNTER — Other Ambulatory Visit: Payer: Self-pay

## 2022-12-14 DIAGNOSIS — R0789 Other chest pain: Secondary | ICD-10-CM | POA: Insufficient documentation

## 2022-12-14 LAB — CBC
HCT: 46.5 % (ref 39.0–52.0)
Hemoglobin: 16.2 g/dL (ref 13.0–17.0)
MCH: 30.4 pg (ref 26.0–34.0)
MCHC: 34.8 g/dL (ref 30.0–36.0)
MCV: 87.2 fL (ref 80.0–100.0)
Platelets: 304 10*3/uL (ref 150–400)
RBC: 5.33 MIL/uL (ref 4.22–5.81)
RDW: 11.9 % (ref 11.5–15.5)
WBC: 9 10*3/uL (ref 4.0–10.5)
nRBC: 0 % (ref 0.0–0.2)

## 2022-12-14 LAB — TROPONIN I (HIGH SENSITIVITY)
Troponin I (High Sensitivity): 4 ng/L (ref <18)
Troponin I (High Sensitivity): 4 ng/L (ref <18)

## 2022-12-14 LAB — BASIC METABOLIC PANEL
Anion gap: 14 (ref 5–15)
BUN: 9 mg/dL (ref 6–20)
CO2: 25 mmol/L (ref 22–32)
Calcium: 9.5 mg/dL (ref 8.9–10.3)
Chloride: 99 mmol/L (ref 98–111)
Creatinine, Ser: 1.06 mg/dL (ref 0.61–1.24)
GFR, Estimated: >60 mL/min (ref >60)
Glucose, Bld: 84 mg/dL (ref 70–99)
Potassium: 3.9 mmol/L (ref 3.5–5.1)
Sodium: 138 mmol/L (ref 135–145)

## 2022-12-14 MED ORDER — ETODOLAC 400 MG PO TABS: 400.0000 mg | ORAL_TABLET | Freq: Two times a day (BID) | ORAL | 0 refills | Status: DC

## 2022-12-14 NOTE — ED Provider Notes (Signed)
[facility] AT [facility] Provider Note   CSN: [id number] Arrival date & time: 12/14/22  1555     History  Chief Complaint  Patient presents with   Chest Pain    Brandon Maldonado is a 37 y.o. male.  37 year old male presents today for concern of right-sided chest pain onset since last night.  Has been intermittent since.  No identifiable alleviating or aggravating factors.  Denies any significant family history of cardiac disease.  No personal history of CAD.  History of [place] syndrome.  Congenital absence of pectoral muscle.  Underwent surgery.  States he occasionally gets muscle aches in various areas.  He is concerned due to not having chest pain like this before to ensure that it is not cardiac related.  No associated dyspnea, lightheadedness, diaphoresis, or nausea.  Symptoms are not pleuritic in nature.  No recent long travel, prior history of DVT or PE.  Currently asymptomatic.  The history is provided by the patient. No language interpreter was used.       Home Medications Prior to Admission medications   Medication Sig Start Date End Date Taking? Authorizing Provider  etodolac (LODINE) 400 MG tablet Take 1 tablet (400 mg total) by mouth 2 (two) times daily. 12/14/22  Yes Karie Mainland, Tocarra Gassen, PA-C  SUMAtriptan (IMITREX) 50 MG tablet Take 1 tablet (50 mg total) by mouth every 2 (two) hours as needed for migraine. May repeat in 2 hours if headache persists or recurs. 02/05/22   Ardith Dark, MD      Allergies    Amoxicillin    Review of Systems   Review of Systems  Constitutional:  Negative for diaphoresis and fever.  Respiratory:  Negative for shortness of breath.   Cardiovascular:  Positive for chest pain.  Gastrointestinal:  Negative for nausea.  Neurological:  Negative for light-headedness.    Physical Exam Updated Vital Signs BP (!) 137/92   Pulse 66   Temp 98 F (36.7 C) (Oral)   Resp 15   Ht 6\' 3"  (1.905 m)   Wt 95.4 kg    SpO2 100%   BMI 26.29 kg/m  Physical Exam Vitals and nursing note reviewed.  Constitutional:      General: He is not in acute distress.    Appearance: Normal appearance. He is not ill-appearing.  HENT:     Head: Normocephalic and atraumatic.     Nose: Nose normal.  Eyes:     General: No scleral icterus.    Extraocular Movements: Extraocular movements intact.     Conjunctiva/sclera: Conjunctivae normal.  Cardiovascular:     Rate and Rhythm: Normal rate and regular rhythm.     Heart sounds: Normal heart sounds.  Pulmonary:     Effort: Pulmonary effort is normal. No respiratory distress.     Breath sounds: Normal breath sounds. No wheezing or rales.  Abdominal:     General: There is no distension.     Tenderness: There is no abdominal tenderness.  Musculoskeletal:        General: Normal range of motion.     Cervical back: Normal range of motion.  Skin:    General: Skin is warm and dry.  Neurological:     General: No focal deficit present.     Mental Status: He is alert. Mental status is at baseline.     ED Results / Procedures / Treatments   Labs (all labs ordered are listed, but only abnormal results are displayed) Labs Reviewed  BASIC METABOLIC PANEL  CBC  TROPONIN I (HIGH SENSITIVITY)  TROPONIN I (HIGH SENSITIVITY)    EKG None  Radiology DG Chest 2 View  Result Date: 12/14/2022 CLINICAL DATA:  Chest pain on the right for 30 minutes. EXAM: CHEST - 2 VIEW COMPARISON:  None Available. FINDINGS: The heart size and mediastinal contours are within normal limits. Both lungs are clear. The visualized skeletal structures are unremarkable. IMPRESSION: No active cardiopulmonary disease. Electronically Signed   By: Minerva Fester M.D.   On: 12/14/2022 17:45    Procedures Procedures    Medications Ordered in ED Medications - No data to display  ED Course/ Medical Decision Making/ A&P                                 Medical Decision Making Risk Prescription drug  management.   37 year old male presents with right-sided chest pain.  Currently asymptomatic.  Intermittent since last night.  History of [place] syndrome.  No significant cardiac risk factors.  Workup reassuring.  CBC, BMP unremarkable.  Troponin negative x 2.  Chest x-ray without acute cardiopulmonary process.  EKG without acute ischemic changes.  Low heart score.  Anti-inflammatory cardiology referral given.  Patient discharged in appropriate condition.  Return precautions discussed.   Final Clinical Impression(s) / ED Diagnoses Final diagnoses:  Atypical chest pain    Rx / DC Orders ED Discharge Orders          Ordered    Ambulatory referral to Cardiology       Comments: If you have not heard from the Cardiology office within the next 72 hours please call [phone].   12/14/22 1944    etodolac (LODINE) 400 MG tablet  2 times daily        12/14/22 1945              Marita Kansas, PA-C 12/14/22 1952    Coral Spikes, DO 12/15/22 [phone]

## 2022-12-14 NOTE — Discharge Instructions (Signed)
Your today was overall reassuring.  No concerning cause of your chest pain.  I have sent an anti-inflammatory medication into the pharmacy.  Of also given your cardiology referral.  For any concerning symptoms return to the emergency room.

## 2022-12-14 NOTE — ED Triage Notes (Signed)
The pt is c/o rt sided chest pain for 30 minutes ago  now the pain is gone.  He has a history of some chest pain but was found to be non-cardiac some time ago

## 2022-12-23 ENCOUNTER — Ambulatory Visit (INDEPENDENT_AMBULATORY_CARE_PROVIDER_SITE_OTHER): Payer: Self-pay | Admitting: Family Medicine

## 2022-12-23 ENCOUNTER — Encounter: Payer: Self-pay | Admitting: Family Medicine

## 2022-12-23 VITALS — BP 139/96 | HR 67 | Temp 97.5°F | Ht 75.0 in | Wt 213.0 lb

## 2022-12-23 DIAGNOSIS — F439 Reaction to severe stress, unspecified: Secondary | ICD-10-CM

## 2022-12-23 DIAGNOSIS — G4733 Obstructive sleep apnea (adult) (pediatric): Secondary | ICD-10-CM | POA: Insufficient documentation

## 2022-12-23 DIAGNOSIS — G4730 Sleep apnea, unspecified: Secondary | ICD-10-CM

## 2022-12-23 DIAGNOSIS — R0789 Other chest pain: Secondary | ICD-10-CM

## 2022-12-23 DIAGNOSIS — R030 Elevated blood-pressure reading, without diagnosis of hypertension: Secondary | ICD-10-CM | POA: Insufficient documentation

## 2022-12-23 HISTORY — DX: Elevated blood-pressure reading, without diagnosis of hypertension: R03.0

## 2022-12-23 HISTORY — DX: Sleep apnea, unspecified: G47.30

## 2022-12-23 NOTE — Patient Instructions (Addendum)
It was very nice to see you today!  I am glad that you are feeling better  Please continue to work on stress reduction techniques  I refer you for a sleep study.   Return if symptoms worsen or fail to improve.   Take care, Dr Jimmey Ralph  PLEASE NOTE:  If you had any lab tests, please let us know if you have not heard back within a few days. You may see your results on mychart before we have a chance to review them but we will give you a call once they are reviewed by [place].   If we ordered any referrals today, please let us know if you have not heard from their office within the next week.   If you had any urgent prescriptions sent in today, please check with the pharmacy within an hour of our visit to make sure the prescription was transmitted appropriately.   Please try these tips to maintain a healthy lifestyle:  Eat at least 3 REAL meals and 1-2 snacks per day.  Aim for no more than 5 hours between eating.  If you eat breakfast, please do so within one hour of getting up.   Each meal should contain half fruits/vegetables, one quarter protein, and one quarter carbs (no bigger than a computer mouse)  Cut down on sweet beverages. This includes juice, soda, and sweet tea.   Drink at least 1 glass of water with each meal and aim for at least 8 glasses per day  Exercise at least 150 minutes every week.

## 2022-12-23 NOTE — Assessment & Plan Note (Signed)
Mildly elevated today.  He will monitor at home for the next several weeks and let us know if persistently elevated.  Would consider trial of amlodipine if still elevated at home.  Previously has been well-controlled.

## 2022-12-23 NOTE — Progress Notes (Signed)
   Brandon Maldonado is a 37 y.o. male who presents today for an office visit.  Assessment/Plan:  New/Acute Problems: Chest Wall Pain Symptoms have resolved.  Sounds like it may have been musculoskeletal in nature.  His cardiac workup in the ED was negative.  We discussed this again today and reassured patient.  He has been under quite a bit of stress as below which is likely contributing as well.  Given that symptoms have resolved do not need to do any further workup until his follow-up appoint with cardiology in couple months though he will let us know if he has any recurrence.  Stress Overall managing okay.  We did discuss starting medications or referring to see a counselor or therapist however he declined.  Likely contributing to above chest wall pain.  Briefly discussed stress management techniques.  He will let me know if he changes mind about either medications or referral to see a therapist.  Chronic Problems Addressed Today: Sleep-disordered breathing Based on history likely has OSA due to witnessed apneic episodes.  May be contributing to elevated blood pressure reading and chest wall pain.  Will place referral to sleep study.  Elevated blood pressure reading Mildly elevated today.  He will monitor at home for the next several weeks and let us know if persistently elevated.  Would consider trial of amlodipine if still elevated at home.  Previously has been well-controlled.     Subjective:  HPI:  See A/P for status of chronic conditions.  Patient is here today for ED follow-up.  He went to the ED 9 days ago with sudden onset of right sided chest pain.  Had workup there including labs and EKG which were all negative.  He was referred to cardiology and discharged home.  They also started him on an anti-inflammatory. HE stopped taking this as he felt like it was causing his symptoms to get worse.  He has not had any symptoms recur for the last several days.  He described the pain  as a sharp nervelike pain.  No shortness of breath.  Pain was sudden onset occurring in right side of his chest.  No recent injuries or illnesses.  No obvious precipitating events.  Does have congenital absence of pectoral muscle and does occasionally get muscle aches and pains in different parts specifically in his back.  He does also admit he has been under more stress recently related to work.  Overall is managing this okay.  Is also concerned about sleep apnea.  He has had multiple witnessed apneic episodes by his wife.  He is interested in sleep referral.       Objective:  Physical Exam: BP (!) 139/96   Pulse 67   Temp (!) 97.5 F (36.4 C) (Temporal)   Ht 6\' 3"  (1.905 m)   Wt 213 lb (96.6 kg)   SpO2 98%   BMI 26.62 kg/m   Gen: No acute distress, resting comfortably CV: Regular rate and rhythm with no murmurs appreciated Pulm: Normal work of breathing, clear to auscultation bilaterally with no crackles, wheezes, or rhonchi Neuro: Grossly normal, moves all extremities Psych: Normal affect and thought content      Rachel Samples M. Jimmey Ralph, MD 12/23/2022 12:07 PM

## 2022-12-23 NOTE — Assessment & Plan Note (Signed)
Based on history likely has OSA due to witnessed apneic episodes.  May be contributing to elevated blood pressure reading and chest wall pain.  Will place referral to sleep study.

## 2023-01-29 ENCOUNTER — Encounter: Payer: Self-pay | Admitting: Family Medicine

## 2023-01-29 NOTE — Telephone Encounter (Signed)
See note

## 2023-01-29 NOTE — Telephone Encounter (Signed)
His readings are a bit higher than what we would like.  Recommend he come in for office visit.  Please make sure he brings his cuff with him so that we can compare to ours.  Katina Degree. Jimmey Ralph, MD 01/29/2023 10:50 AM

## 2023-02-24 NOTE — Progress Notes (Deleted)
CARDIOLOGY CONSULT NOTE       Patient ID: Brandon Maldonado MRN: [id number] DOB/AGE: [birth year] 37 y.o.  Admit date: (Not on file) Referring Physician: Jimmey Ralph Primary Physician: Ardith Dark, MD Primary Cardiologist: New Reason for Consultation: Chest Pain  Active Problems:   * No active hospital problems. *   HPI:  37 y.o. referred by primary Jimmey Ralph for chest pain. History of GERD/Reflux, and HTN.  Seen in office 12/23/22 complained of musculoskeletal sounding chest wall pain. Seen in ED 12/14/22 for same Pain was right sided intermittent over 36 hours Has a congenital abnormality with absence of pectoral muscle Prior surgery for this on left with chronic cervical muscular pain after surgery In 2008 moved a muscle from his back to left pectoral due to [place] syndrome Has had PT/OT and muscle relaxers in past as well as topical anesthetics In ER r/o CXR NAD Has seen [facility] with negative plane films of left shoulder 01/23/21 Currently not on medications  Other than BP no significant CRF;s ECG is normal   ***  BP has been elevated ? 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    Family History  Problem Relation Age of Onset   Vitamin D deficiency Mother    Cancer - Lung Maternal Grandmother    Cancer Maternal Grandmother        Breast   Leukemia Maternal Grandfather    Cancer  Maternal Grandfather    Hyperlipidemia Maternal Grandfather    Lung cancer Paternal Grandmother    Cancer Paternal Grandmother     Social History   Socioeconomic History   Marital status: Married    Spouse name: Not on file   Number of children: 0   Years of education: Not on file   Highest education level: Some college, no degree  Occupational History   Not on file  Tobacco Use   Smoking status: Never   Smokeless tobacco: Never  Substance and Sexual Activity   Alcohol use: Yes    Alcohol/week: 2.0 - 3.0 standard drinks of alcohol    Types: 2 - 3 Standard drinks or equivalent per week    Comment: 1 if any occasionally drinker   Drug use: No   Sexual activity: Yes  Other Topics Concern   Not on file  Social History Narrative   Patient is married, with no children.   Patient is right handed.   Patient has a high school graduate, and has some college education.   Patient drinks 1 cup of coffee daily.   Social Determinants of Health   Financial Resource Strain: High Risk (08/21/2022)   Overall Financial Resource Strain (CARDIA)    Difficulty of Paying Living Expenses: Hard  Food Insecurity: No Food Insecurity (08/21/2022)   Hunger Vital Sign    Worried About Running Out  of Food in the Last Year: Never true    Ran Out of Food in the Last Year: Never true  Transportation Needs: No Transportation Needs (08/21/2022)   PRAPARE - Administrator, Civil Service (Medical): No    Lack of Transportation (Non-Medical): No  Physical Activity: Insufficiently Active (08/21/2022)   Exercise Vital Sign    Days of Exercise per Week: 2 days    Minutes of Exercise per Session: 60 min  Stress: Stress Concern Present (08/21/2022)   [employer] of Occupational Health - Occupational Stress Questionnaire    Feeling of Stress : To some extent  Social Connections: Moderately Integrated (08/21/2022)   Social Connection and Isolation Panel [NHANES]    Frequency of Communication with  Friends and Family: More than three times a week    Frequency of Social Gatherings with Friends and Family: Once a week    Attends Religious Services: Never    Database administrator or Organizations: Yes    Attends Engineer, structural: More than 4 times per year    Marital Status: Married  Catering manager Violence: Not on file    Past Surgical History:  Procedure Laterality Date   BACK SURGERY  2008   CHEST SURGERY  2008   moved muscle   SHOULDER SURGERY Right 2009   LABRAL TEAR   SHOULDER SURGERY Left 2008   TOE SURGERY Left 2010   4th toe, removed cyst      Current Outpatient Medications:    SUMAtriptan (IMITREX) 50 MG tablet, Take 1 tablet (50 mg total) by mouth every 2 (two) hours as needed for migraine. May repeat in 2 hours if headache persists or recurs., Disp: 10 tablet, Rfl: 0    Physical Exam:    Affect appropriate Healthy:  appears stated age HEENT: normal Neck supple with no adenopathy JVP normal no bruits no thyromegaly Lungs clear with no wheezing and good diaphragmatic motion Heart:  S1/S2 no murmur, no rub, gallop or click PMI normal Prior transposition of shoulder muscle to left chest *** Abdomen: benighn, BS positve, no tenderness, no AAA no bruit.  No HSM or HJR Distal pulses intact with no bruits No edema Neuro non-focal Skin warm and dry No muscular weakness   Labs:   Lab Results  Component Value Date   WBC 9.0 12/14/2022   HGB 16.2 12/14/2022   HCT 46.5 12/14/2022   MCV 87.2 12/14/2022   PLT 304 12/14/2022   No results for input(s): "NA", "K", "CL", "CO2", "BUN", "CREATININE", "CALCIUM", "PROT", "BILITOT", "ALKPHOS", "ALT", "AST", "GLUCOSE" in the last 168 hours.  Invalid input(s): "LABALBU" No results found for: "CKTOTAL", "CKMB", "CKMBINDEX", "TROPONINI" No results found for: "CHOL" No results found for: "HDL" No results found for: "LDLCALC" No results found for: "TRIG" No results found for: "CHOLHDL" No results found  for: "LDLDIRECT"    Radiology: No results found.  EKG: SR rate 96 normal 12/18/22   ASSESSMENT AND PLAN:   Chest Pain: in setting of [place] syndrome and prior surgery to transpose shoulder muscle to left pectoral area. R/O normal ECG shared decision making favor coronary calcium score and ETT to further risk stratify HTN: borderline related to stress f/u primary DASH diet  Migraines:  related to chronic cervical dx with surgery has PRN imitrex per primary  Calcium score ETT  F/U PRN pending studies  Signed: Charlton Haws 02/24/2023, 8:53 AM

## 2023-03-04 ENCOUNTER — Ambulatory Visit: Payer: Self-pay | Admitting: Cardiovascular Disease

## 2023-04-17 ENCOUNTER — Institutional Professional Consult (permissible substitution): Payer: Self-pay | Admitting: Neurology

## 2023-04-18 ENCOUNTER — Ambulatory Visit: Payer: Self-pay | Admitting: Cardiovascular Disease

## 2023-04-24 ENCOUNTER — Encounter: Payer: Self-pay | Admitting: Neurology

## 2023-04-24 ENCOUNTER — Ambulatory Visit (INDEPENDENT_AMBULATORY_CARE_PROVIDER_SITE_OTHER): Payer: Self-pay | Admitting: Neurology

## 2023-04-24 VITALS — BP 148/98 | HR 68 | Ht 74.0 in | Wt 215.0 lb

## 2023-04-24 DIAGNOSIS — G478 Other sleep disorders: Secondary | ICD-10-CM | POA: Insufficient documentation

## 2023-04-24 DIAGNOSIS — R0683 Snoring: Secondary | ICD-10-CM | POA: Insufficient documentation

## 2023-04-24 DIAGNOSIS — F518 Other sleep disorders not due to a substance or known physiological condition: Secondary | ICD-10-CM | POA: Insufficient documentation

## 2023-04-24 DIAGNOSIS — R5383 Other fatigue: Secondary | ICD-10-CM | POA: Insufficient documentation

## 2023-04-24 DIAGNOSIS — G4730 Sleep apnea, unspecified: Secondary | ICD-10-CM

## 2023-04-24 NOTE — Progress Notes (Signed)
 SLEEP MEDICINE CLINIC    Provider:  Dedra Gores, MD  Primary Care Physician:  Kennyth Worth HERO, MD [address] [place] [address]     Referring Provider: Kennyth Worth HERO, Md [address] [place],  [place] [address]          Chief Complaint according to patient   Patient presents with:     New sleep consult            HISTORY OF PRESENT ILLNESS:  Brandon Maldonado is a 38 y.o. male patient who is seen upon referral on 04/24/2023 from PCP  for a sleep cu consultation. .  Chief concern according to patient :  New Patient (Initial Visit), self pay. Headaches, clenching teeth in spite of night guard, needing glasses,  atypical chest pain, headaches  were treated once by Dr Ines , has currently 5 times a week headaches now, left side, left eye and above left eye brow, left occipital.  Left jaw(!). 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   Has been reporting vision changes in left eye.  Has had extensive back surgery at age 28 and this dictates the position of sleep and rest. Latissimus muscle transferred to create the left pectoral muscle.  Left arm and shoulder movements are weaker on the left.    I have the pleasure of seeing Brandon Maldonado on 04/24/23 , who is a right -handed male with a possible sleep disorder.      Sleep relevant medical history: Nocturia 2-4 times, wakes up for other reasons, then d goes to urinate. ,  Vivid dreams, nightmares, not enacting these-  Snoring. Bruxism .with oral guard from the dentist ETTER Alan Holm)  Witnessed apnea and snoring .  No Tonsillectomy, no cervical spine surgery.   Family medical /sleep history: brother on CPAP with OSA, mother healthy. Brother had migraine.  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 Some outdoor activity .  Family status is married , with one child of  46 years age  The patient currently works fully employed . Tobacco use: none .   ETOH use:  rarely now- causes headaches.  Caffeine intake in form of Coffee( 2 in am ) Soda( 1/day) Tea ( 1/day) , no energy drinks. Exercise in form of walking. Gym [employer] member.      Sleep habits are as follows: The patient's dinner time is between  6-7 PM. The patient goes to bed at 10-11 PM and continues to sleep for 7-8 hours, wakes for various reasons but always need to void when awake- the first time at 2 AM.    The preferred sleep position is laterally , with the support of 1 pillow- flat mattress  Dreams are reportedly frequent/vivid.   The patient wakes up spontaneously at 7 AM  , before an alarm. 7.15-7.30   AM is the usual rise time. He reports not feeling fully refreshed or restored in AM, with symptoms such as dry mouth, morning headaches, and residual fatigue.  Naps are taken infrequently, lasting hours and feel not refreshing, more groggy afterwards-    Review of Systems: Out of a complete 14 system review, the patient complains of only the following symptoms, and all other reviewed systems are negative.:  Fatigue, sleepiness , snoring, fragmented sleep, Insomnia, Nocturia    How likely are  you to doze in the following situations: 0 = not likely, 1 = slight chance, 2 = moderate chance, 3 = high chance   Sitting and Reading? Watching Television? Sitting inactive in a public place (theater or meeting)? As a passenger in a car for an hour without a break? Lying down in the afternoon when circumstances permit? Sitting and talking to someone? Sitting quietly after lunch without alcohol? In a car, while stopped for a few minutes in traffic?   Total = 11/ 24 points   FSS endorsed at 38/ 63 points.   Social History   Socioeconomic History   Marital status: Married    Spouse name: Not on file   Number of children: 0   Years of education: Not on file   Highest education level: Some  college, no degree  Occupational History   Not on file  Tobacco Use   Smoking status: Never   Smokeless tobacco: Never  Substance and Sexual Activity   Alcohol use: Yes    Alcohol/week: 2.0 - 3.0 standard drinks of alcohol    Types: 2 - 3 Standard drinks or equivalent per week    Comment: 1 if any occasionally drinker   Drug use: No   Sexual activity: Yes  Other Topics Concern   Not on file  Social History Narrative   Patient is married, with no children.   Patient is right handed.   Patient has a high school graduate, and has some college education.   Patient drinks 1 cup of coffee daily.   Social Drivers of Corporate Investment Banker Strain: High Risk (08/21/2022)   Overall Financial Resource Strain (CARDIA)    Difficulty of Paying Living Expenses: Hard  Food Insecurity: No Food Insecurity (08/21/2022)   Hunger Vital Sign    Worried About Running Out of Food in the Last Year: Never true    Ran Out of Food in the Last Year: Never true  Transportation Needs: No Transportation Needs (08/21/2022)   PRAPARE - Administrator, Civil Service (Medical): No    Lack of Transportation (Non-Medical): No  Physical Activity: Insufficiently Active (08/21/2022)   Exercise Vital Sign    Days of Exercise per Week: 2 days    Minutes of Exercise per Session: 60 min  Stress: Stress Concern Present (08/21/2022)   [employer] of Occupational Health - Occupational Stress Questionnaire    Feeling of Stress : To some extent  Social Connections: Moderately Integrated (08/21/2022)   Social Connection and Isolation Panel [NHANES]    Frequency of Communication with Friends and Family: More than three times a week    Frequency of Social Gatherings with Friends and Family: Once a week    Attends Religious Services: Never    Database Administrator or Organizations: Yes    Attends Engineer, Structural: More than 4 times per year    Marital Status: Married    Family History  Problem  Relation Age of Onset   Vitamin D deficiency Mother    Cancer - Lung Maternal Grandmother    Cancer Maternal Grandmother        Breast   Leukemia Maternal Grandfather    Cancer Maternal Grandfather    Hyperlipidemia Maternal Grandfather    Lung cancer Paternal Grandmother    Cancer Paternal Grandmother     Past Medical History:  Diagnosis Date   Elevated blood pressure reading 12/23/2022   Esophageal reflux    GERD (gastroesophageal reflux disease)  Left foot pain 05/29/2018   L great toe XR - 03/27/18     L foot MRI - 05/04/18     Left shoulder pain 01/18/2021   Migraine    Migraine    Other bursitis disorders    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 May repeat in 2 hours if headache persists or recurs. 10 tablet 0   No current facility-administered medications on file prior to visit.    Allergies  Allergen Reactions   Amoxicillin Rash     DIAGNOSTIC DATA (LABS, IMAGING, TESTING) - I reviewed patient records, labs, notes, testing and imaging myself where available.  Lab Results  Component Value Date   WBC 9.0 12/14/2022   HGB 16.2 12/14/2022   HCT 46.5 12/14/2022   MCV 87.2 12/14/2022   PLT 304 12/14/2022      Component Value Date/Time   NA 138 12/14/2022 1618   K 3.9 12/14/2022 1618   CL 99 12/14/2022 1618   CO2 25 12/14/2022 1618   GLUCOSE 84  12/14/2022 1618   BUN 9 12/14/2022 1618   CREATININE 1.06 12/14/2022 1618   CALCIUM 9.5 12/14/2022 1618   GFRNONAA >60 12/14/2022 1618   No results found for: CHOL, HDL, LDLCALC, LDLDIRECT, TRIG, CHOLHDL No results found for: YHAJ8R No results found for: VITAMINB12 No results found for: TSH  PHYSICAL EXAM:  Today's Vitals   04/24/23 1518  BP: (!) 148/98  Pulse: 68  Weight: 215 lb (97.5 kg)  Height: 6' 2 (1.88 m)   Body mass index is 27.6 kg/m.   Wt Readings from Last 3 Encounters:  04/24/23 215 lb (97.5 kg)  12/23/22 213 lb (96.6 kg)  12/14/22 210 lb 5.1 oz (95.4 kg)     Ht Readings from Last 3 Encounters:  04/24/23 6' 2 (1.88 m)  12/23/22 6' 3 (1.905 m)  12/14/22 6' 3 (1.905 m)      General: The patient is awake, alert and appears not in acute distress. The patient is well groomed. Head: Normocephalic, atraumatic. Neck is supple.  Mallampati 2, fissured tongue  neck circumference:17 inches . Nasal airflow  patent.  Retrognathia is not seen. TMJ click , bruxism marks.  Dental status:  biological  Cardiovascular:  Regular rate and cardiac rhythm by pulse,  without distended neck veins. Respiratory: Lungs are clear to auscultation.  Skin:  Without evidence of ankle edema, or rash. Trunk: The patient's posture is erect.   NEUROLOGIC EXAM: The patient is awake and alert, oriented to place and time.   Memory subjective described as intact.  Attention span & concentration ability appears normal.  Speech is fluent,  without  dysarthria, dysphonia or aphasia.  Mood and affect are appropriate.   Cranial nerves: no loss of smell or taste reported  Pupils are equal and  briskly reactive to light. Funduscopic exam deferred.  Extraocular movements in vertical and horizontal planes were intact and without nystagmus. No Diplopia. Visual fields by finger perimetry are intact. Hearing was intact to soft voice and finger rubbing.    Facial sensation intact  to fine touch.  Facial motor strength is symmetric and tongue and uvula move midline.  Neck ROM : rotation, tilt and flexion extension were normal for age and shoulder shrug was symmetrical.    Motor exam:  Symmetric bulk, tone and ROM.   Normal tone without cog -wheeling, symmetric grip strength .   Sensory:  Fine touch, vibration were tested.  Proprioception tested in the upper extremities was normal.  Coordination: Rapid alternating movements in the fingers/hands were of normal speed.    Gait and station: Patient could rise unassisted from a seated position, walked without assistive device.  Stance is of normal width/ base. Deep tendon reflexes: in the  upper and lower extremities are symmetric and trace. Babinski response was deferred .    ASSESSMENT AND PLAN 29 - year -old male here with:    1) Mr. Fleischer reports an ongoing struggle with daytime fatigue and a manageable degree of sleepiness.  He does not suffer sleep attacks and he can avoid napping actually he does not enjoy naps as he does not wake up more refreshed or restored.  He has a chronic headache disorder.  The patient also had extensive back surgery and actually muscle reposition surgery at age 68.  We are meeting today because his wife has witnessed snoring and apneas and is concerned that he may have sleep apnea.  A brother was just diagnosed with sleep apnea and began treatment for it and used a bottle of feels that he and his brother resemble each other anatomically and body mass wise.  He rarely feels fully refreshed and restored in the morning also he does not describe sleep inertia.  He does not need multiple alarms to wake up and he is usually spontaneously awake before his alarm rings.  He also does not have trouble to initiate sleep but he is not staying asleep.  His sleep is fragmented due to external factors and he may be a light sleeper, but each time he wakes up for any other reason he still has the urge to  void and go to the bathroom and this interrupts his sleep even more.   1) my goal today is to figure out if Mr. Kelle has a organic sleep disorder such as sleep apnea, the symptoms as described by his wife indicate that he may.  He does not have a lot of other anatomical risk factors.  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  He has refrained from overusing caffeine and has reduced the amount of caffeine he takes every day.   I have the option of inviting Mr. Odonovan either for an in-lab sleep study or for a screening test which would be a home sleep test.  The home sleep test is financially much easier to afford and will give me at least the data relating to presence of hypoxia, heart rate, and apnea frequency.  Usually this can be correlated to the sleep position as well.  As the patient is in agreement I will order a home sleep test for him and if we find hypoxia, sleep apnea or very great  variation in heart rate on these recordings, we will follow-up with a visit for treatment. HST      I plan to follow up prn  through our NP within 3-5 months.   I would like to thank Kennyth Worth HERO, MD and Kennyth Worth HERO, Md [address] [place],  [place] [address] for allowing me to meet with and to take care of this pleasant patient.    After spending a total time of  35  minutes face to face and additional time for physical and neurologic examination, review of laboratory studies,  personal review of imaging studies, reports and results of other testing and review of referral information / records as far as provided in visit,   Electronically signed by: Dedra Gores, MD 04/24/2023 3:39 PM  [facility] and [facility] Sleep Board certified by The [employer] of Sleep Medicine and Diplomate of the [employer] of Sleep Medicine. Board certified In Neurology through the  ABPN, Fellow of the [employer] of Neurology.

## 2023-04-24 NOTE — Patient Instructions (Addendum)
 Screening for Sleep Apnea  Sleep apnea is a condition in which breathing pauses or becomes shallow during sleep. Sleep apnea screening is a test to determine if you are at risk for sleep apnea. The test includes a series of questions. It will only takes a few minutes. Your health care provider may ask you to have this test in preparation for surgery or as part of a physical exam. What are the symptoms of sleep apnea? Common symptoms of sleep apnea include: Snoring. Waking up often at night. Daytime sleepiness. Pauses in breathing. Choking or gasping during sleep. Irritability. Forgetfulness. Trouble thinking clearly. Depression. Personality changes. Most people with sleep apnea do not know that they have it. What are the advantages of sleep apnea screening? Getting screened for sleep apnea can help: Ensure your safety. It is important for your health care providers to know whether or not you have sleep apnea, especially if you are having surgery or have other long-term (chronic) health conditions. Improve your health and allow you to get a better night's rest. Restful sleep can help you: Have more energy. Lose weight. Improve high blood pressure. Improve diabetes management. Prevent stroke. Prevent car accidents. What happens during the screening? Screening usually includes being asked a list of questions about your sleep quality. Some questions you may be asked include: Do you snore? Is your sleep restless? Do you have daytime sleepiness? Has a partner or spouse told you that you stop breathing during sleep? Have you had trouble concentrating or memory loss? What is your age? What is your neck circumference? To measure your neck, keep your back straight and gently wrap the tape measure around your neck. Put the tape measure at the middle of your neck, between your chin and collarbone. What is your sex assigned at birth? Do you have or are you being treated for high blood  pressure? If your screening test is positive, you are at risk for the condition. Further testing may be needed to confirm a diagnosis of sleep apnea. Where to find more information You can find screening tools online or at your health care clinic. For more information about sleep apnea screening and healthy sleep, visit these websites: Centers for Disease Control and Prevention: footballexhibition.com.br American Sleep Apnea Association: www.sleepapnea.org Contact a health care provider if: You think that you may have sleep apnea. Summary Sleep apnea screening can help determine if you are at risk for sleep apnea. It is important for your health care providers to know whether or not you have sleep apnea, especially if you are having surgery or have other chronic health conditions. You may be asked to take a screening test for sleep apnea in preparation for surgery or as part of a physical exam. This information is not intended to replace advice given to you by your health care provider. Make sure you discuss any questions you have with your health care provider. Document Revised: 03/10/2020 Document Reviewed: 03/10/2020 Elsevier Patient Education  2024 Elsevier Inc.    Fatigue If you have fatigue, you feel tired all the time and have a lack of energy or a lack of motivation. Fatigue may make it difficult to start or complete tasks because of exhaustion. Occasional or mild fatigue is often a normal response to activity or life. However, long-term (chronic) or extreme fatigue may be a symptom of a medical condition such as: Depression. Not having enough red blood cells or hemoglobin in the blood (anemia). A problem with a small gland located in the  lower front part of the neck (thyroid disorder). Rheumatologic conditions. These are problems related to the body's defense system (immune system). Infections, especially certain viral infections. Fatigue can also lead to negative health outcomes over  time. Follow these instructions at home: Medicines Take over-the-counter and prescription medicines only as told by your health care provider. Take a multivitamin if told by your health care provider. Do not use herbal or dietary supplements unless they are approved by your health care provider. Eating and drinking  Avoid heavy meals in the evening. Eat a well-balanced diet, which includes lean proteins, whole grains, plenty of fruits and vegetables, and low-fat dairy products. Avoid eating or drinking too many products with caffeine in them. Avoid alcohol. Drink enough fluid to keep your urine pale yellow. Activity  Exercise regularly, as told by your health care provider. Use or practice techniques to help you relax, such as yoga, tai chi, meditation, or massage therapy. Lifestyle Change situations that cause you stress. Try to keep your work and personal schedules in balance. Do not use recreational or illegal drugs. General instructions Monitor your fatigue for any changes. Go to bed and get up at the same time every day. Avoid fatigue by pacing yourself during the day and getting enough sleep at night. Maintain a healthy weight. Contact a health care provider if: Your fatigue does not get better. You have a fever. You suddenly lose or gain weight. You have headaches. You have trouble falling asleep or sleeping through the night. You feel angry, guilty, anxious, or sad. You have swelling in your legs or another part of your body. Get help right away if: You feel confused, feel like you might faint, or faint. Your vision is blurry or you have a severe headache. You have severe pain in your abdomen, your back, or the area between your waist and hips (pelvis). You have chest pain, shortness of breath, or an irregular or fast heartbeat. You are unable to urinate, or you urinate less than normal. You have abnormal bleeding from the rectum, nose, lungs, nipples, or, if you are  male, the vagina. You vomit blood. You have thoughts about hurting yourself or others. These symptoms may be an emergency. Get help right away. Call 911. Do not wait to see if the symptoms will go away. Do not drive yourself to the hospital. Get help right away if you feel like you may hurt yourself or others, or have thoughts about taking your own life. Go to your nearest emergency room or: Call 911. Call the National Suicide Prevention Lifeline at 928-422-7434 or 988. This is open 24 hours a day. Text the Crisis Text Line at 580-186-9166. Summary If you have fatigue, you feel tired all the time and have a lack of energy or a lack of motivation. Fatigue may make it difficult to start or complete tasks because of exhaustion. Long-term (chronic) or extreme fatigue may be a symptom of a medical condition. Exercise regularly, as told by your health care provider. Change situations that cause you stress. Try to keep your work and personal schedules in balance. This information is not intended to replace advice given to you by your health care provider. Make sure you discuss any questions you have with your health care provider. Document Revised: 01/22/2021 Document Reviewed: 01/22/2021 Elsevier Patient Education  2024 Elsevier Inc. Healthy Living: Sleep In this video, you will learn why sleep is an important part of a healthy lifestyle. To view the content, go to this web  address: https://pe.elsevier.[email or web address]  This video will expire on: 10/06/2024. If you need access to this video following this date, please reach out to the healthcare provider who assigned it to you. This information is not intended to replace advice given to you by your health care provider. Make sure you discuss any questions you have with your health care provider. Elsevier Patient Education  2024 [employer].

## 2023-05-01 ENCOUNTER — Institutional Professional Consult (permissible substitution): Payer: Self-pay | Admitting: Neurology

## 2023-05-12 ENCOUNTER — Telehealth: Payer: Self-pay | Admitting: Neurology

## 2023-05-12 NOTE — Telephone Encounter (Signed)
HST- self pay -pt aware of cost structure   Patient is scheduled at [facility] For 05/27/23 at 8 AM  Mailed packet to the patient.

## 2023-05-27 ENCOUNTER — Ambulatory Visit: Payer: Self-pay | Admitting: Neurology

## 2023-05-27 DIAGNOSIS — F518 Other sleep disorders not due to a substance or known physiological condition: Secondary | ICD-10-CM

## 2023-05-27 DIAGNOSIS — G478 Other sleep disorders: Secondary | ICD-10-CM

## 2023-05-27 DIAGNOSIS — R5383 Other fatigue: Secondary | ICD-10-CM

## 2023-05-27 DIAGNOSIS — G4733 Obstructive sleep apnea (adult) (pediatric): Secondary | ICD-10-CM

## 2023-05-27 DIAGNOSIS — R0683 Snoring: Secondary | ICD-10-CM

## 2023-05-28 NOTE — Progress Notes (Signed)
[facility] Dhruva Orndoff 38 year old male [birth year]   HOME SLEEP TEST REPORT ( by Watch PAT)   STUDY DATE:  05-27-2023    ORDERING CLINICIAN: Melvyn Novas, MD  REFERRING CLINICIAN: Dr Jimmey Ralph   CLINICAL INFORMATION/HISTORY: Kojo Liby is a 38 y.o. male patient who is seen upon referral on 04/24/2023 :  Angina, Palpitation , now referred for  Snoring. Has an active  medical problem list of : Headaches, clenching teeth in spite of night guard, needing glasses,  atypical chest pain, headaches  were treated once by Dr Lucia Gaskins , has currently 5 times a week headaches now, left side, left eye and above left eye brow, left occipital.  Left jaw(!). 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   Has been reporting vision changes in left eye.  Has had extensive back surgery at age 38 and this dictates the position of sleep and rest. Latissimus muscle transferred to create the left pectoral muscle.  Left arm and shoulder movements are weaker on the left. His brother is on CPAP with OSA, mother healthy. Brother had migraine.  father died of MI, CAD,( drinker, smoker).  My goal today is to figure out if [name] has a organic sleep disorder such as sleep apnea, the symptoms as described by his wife indicate that he may.  He does not have a lot of other anatomical risk factors.  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  He has refrained from overusing caffeine and has reduced the amount of caffeine he takes every day.       Epworth sleepiness score:11/ 24 points   FSS endorsed at 38/ 63 points.      BMI:  27.6 kg/m   Neck Circumference: 17"   FINDINGS:   Sleep Summary:   Total Recording Time (hours, min):    8 hours 10 minutes   Total Sleep Time (hours, min):    7 hours 40 minutes             Percent REM (%): 26%                                        Respiratory Indices:   Calculated pAHI (per hour) following AASM criteria:    42/h     ,  According to the home sleep test device calculation there were no central apneas present but the distribution between REM sleep apnea and non-REM sleep apnea is unusual :                   REM pAHI:   29.4/h                                            NREM pAHI:     46.4/h                         Positional AHI:     The patient slept equally long times in prone position and left lateral position prone position was associated with an AHI of  29, left left lateral position with an AHI of 53/h.  There were 70 minutes sleep on the right side with an AHI of only 15.4/h and there were 94 minutes of supine sleep -here , the AHI peaked at 65/h.  Snoring reached a mean volume of 48 dB which is considered extremely loud and it was present for two thirds of the total recorded sleep time.                                              Oxygen Saturation Statistics:   Oxygen Saturation (%) Mean: 94%                   O2 Saturation Range (%):   Between 77 and 100%                                    O2 Saturation (minutes) <89%:       11.4 minutes Pulse Rate Statistics:   Pulse Mean (bpm):     59            Pulse Range:   Between 40 and 116 bpm              IMPRESSION:  This HST confirms the presence of severe and supposedly all obstructive sleep apnea.  There is also a clinically significant nadir and low oxygen at 77% saturation but the total time in oxygen desaturation was not very long. There were many periods of bradycardia seen intermittent with tachycardia- the patient's heart rate is seen here, but the device cannot differentiate cardiac rhythms.  These may account for his palpitations .   I am concerned that there could be central apnea present which may not be detected by this home sleep test device.  My next step will be to order a auto -titration CPAP device for this  patient, which may for him be difficult to afford.    RECOMMENDATION: Autotitration CPAP device with a setting between 6 and 18 cm water pressure with 3 cm EPR, heated humidification and interface to be fitted.  Please advise the DME of the insurance status.  Cc: PCP Dr Jimmey Ralph, MD     INTERPRETING PHYSICIAN:   Melvyn Novas, MD  [facility] Board certified by The [employer] of Sleep Medicine and Diplomate of the [employer] of Sleep Medicine. Board certified In Neurology through the ABPN, Fellow of the [employer] of Neurology.

## 2023-06-06 ENCOUNTER — Ambulatory Visit (HOSPITAL_BASED_OUTPATIENT_CLINIC_OR_DEPARTMENT_OTHER): Payer: Self-pay | Admitting: Orthopaedic Surgery

## 2023-06-06 ENCOUNTER — Encounter (HOSPITAL_BASED_OUTPATIENT_CLINIC_OR_DEPARTMENT_OTHER): Payer: Self-pay | Admitting: Orthopaedic Surgery

## 2023-06-06 ENCOUNTER — Encounter: Payer: Self-pay | Admitting: Neurology

## 2023-06-06 ENCOUNTER — Ambulatory Visit (HOSPITAL_BASED_OUTPATIENT_CLINIC_OR_DEPARTMENT_OTHER): Payer: Self-pay

## 2023-06-06 DIAGNOSIS — M25511 Pain in right shoulder: Secondary | ICD-10-CM

## 2023-06-06 NOTE — Procedures (Signed)
[facility] Brandon Maldonado 38 year old male [birth year]   HOME SLEEP TEST REPORT ( by Watch PAT)   STUDY DATE:  05-27-2023    ORDERING CLINICIAN: Melvyn Novas, MD  REFERRING CLINICIAN: Dr Jimmey Ralph   CLINICAL INFORMATION/HISTORY: Brandon Maldonado is a 38 y.o. male patient who is seen upon referral on 04/24/2023 :  Angina, Palpitation , now referred for  Snoring. Has an active  medical problem list of : Headaches, clenching teeth in spite of night guard, needing glasses,  atypical chest pain, headaches  were treated once by Dr Lucia Gaskins , has currently 5 times a week headaches now, left side, left eye and above left eye brow, left occipital.  Left jaw(!). 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   Has been reporting vision changes in left eye.  Has had extensive back surgery at age 38 and this dictates the position of sleep and rest. Latissimus muscle transferred to create the left pectoral muscle.  Left arm and shoulder movements are weaker on the left. His brother is on CPAP with OSA, mother healthy. Brother had migraine.  father died of MI, CAD,( drinker, smoker).  My goal today is to figure out if Brandon Maldonado has a organic sleep disorder such as sleep apnea, the symptoms as described by his wife indicate that he may.  He does not have a lot of other anatomical risk factors.  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  He has refrained from overusing caffeine and has reduced the amount of caffeine he takes every day.       Epworth sleepiness score:11/ 24 points   FSS endorsed at 38/ 63 points.      BMI:  27.6 kg/m   Neck Circumference: 17"   FINDINGS:   Sleep Summary:   Total Recording Time (hours, min):    8 hours 10 minutes   Total Sleep Time (hours, min):    7 hours 40 minutes             Percent REM (%): 26%                                        Respiratory Indices:   Calculated pAHI (per hour) following AASM criteria:    42/h     ,  According to the home sleep test device calculation there were no central apneas present but the distribution between REM sleep apnea and non-REM sleep apnea is unusual :                   REM pAHI:   29.4/h                                            NREM pAHI:     46.4/h                         Positional AHI:     The patient slept equally long times in prone position and left lateral position prone position was associated with an AHI of  29, left left lateral position with an AHI of 53/h.  There were 70 minutes sleep on the right side with an AHI of only 15.4/h and there were 94 minutes of supine sleep -here , the AHI peaked at 65/h.  Snoring reached a mean volume of 48 dB which is considered extremely loud and it was present for two thirds of the total recorded sleep time.                                              Oxygen Saturation Statistics:   Oxygen Saturation (%) Mean: 94%                   O2 Saturation Range (%):   Between 77 and 100%                                    O2 Saturation (minutes) <89%:       11.4 minutes Pulse Rate Statistics:   Pulse Mean (bpm):     59            Pulse Range:   Between 40 and 116 bpm              IMPRESSION:  This HST confirms the presence of severe and supposedly all obstructive sleep apnea.  There is also a clinically significant nadir and low oxygen at 77% saturation but the total time in oxygen desaturation was not very long. There were many periods of bradycardia seen intermittent with tachycardia- the patient's heart rate is seen here, but the device cannot differentiate cardiac rhythms.  These may account for his palpitations .   I am concerned that there could be central apnea present which may not be detected by this home sleep test device.  My next step will be to order a auto -titration CPAP device for this  patient, which may for him be difficult to afford.    RECOMMENDATION: Autotitration CPAP device with a setting between 6 and 18 cm water pressure with 3 cm EPR, heated humidification and interface to be fitted.  Please advise the DME of the insurance status.  Cc: PCP Dr Jimmey Ralph, MD     INTERPRETING PHYSICIAN:   Melvyn Novas, MD  [facility] Sleep Board certified by The [employer] of Sleep Medicine and Diplomate of the [employer] of Sleep Medicine. Board certified In Neurology through the ABPN, Fellow of the [employer] of Neurology.

## 2023-06-06 NOTE — Progress Notes (Signed)
Chief Complaint: Right shoulder pain     History of Present Illness:    Brandon Maldonado is a 38 y.o. male Brandon Maldonado male presents with ongoing right shoulder pain.  He does state that he does have a history of labral repair in his early 58s.  He states that his pain does feel quite similar to this.  He has been attempting to be active with overhead press and working out but this has been limited as the shoulder is continuing to feel painful and give out.  He does feel weaker compared to the contralateral side despite being his dominant arm.    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   Sleep-disordered breathing 12/23/2022   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   Past Surgical History:  Procedure Laterality Date   BACK SURGERY  2008   CHEST SURGERY  2008   moved muscle   SHOULDER SURGERY Right 2009   LABRAL TEAR   SHOULDER SURGERY Left 2008   TOE SURGERY Left 2010   4th toe, removed cyst   Social History   Socioeconomic History   Marital status: Married    Spouse name: Not on file   Number of children: 0   Years of education: Not on file   Highest education level: Some college, no degree  Occupational History   Not on file  Tobacco Use   Smoking status: Never   Smokeless tobacco: Never  Substance and Sexual Activity   Alcohol use: Yes    Alcohol/week: 2.0 - 3.0 standard drinks of alcohol    Types: 2 - 3 Standard drinks or equivalent per week    Comment: 1 if any occasionally drinker   Drug use: No    Sexual activity: Yes  Other Topics Concern   Not on file  Social History Narrative   Patient is married, with no children.   Patient is right handed.   Patient has a high school graduate, and has some college education.   Patient drinks 1 cup of coffee daily.   Social Drivers of Corporate investment banker Strain: High Risk (08/21/2022)   Overall Financial Resource Strain (CARDIA)    Difficulty of Paying Living Expenses: Hard  Food Insecurity: No Food Insecurity (08/21/2022)   Hunger Vital Sign    Worried About Running Out of Food in the Last Year: Never true    Ran Out of Food in the Last Year: Never true  Transportation Needs: No Transportation Needs (08/21/2022)   PRAPARE - Administrator, Civil Service (Medical): No    Lack of Transportation (Non-Medical): No  Physical Activity: Insufficiently Active (08/21/2022)   Exercise Vital Sign    Days of Exercise per Week: 2 days    Minutes of Exercise per Session: 60 min  Stress: Stress Concern Present (08/21/2022)   [employer] of Occupational Health - Occupational Stress Questionnaire    Feeling of Stress : To some extent  Social  Connections: Moderately Integrated (08/21/2022)   Social Connection and Isolation Panel [NHANES]    Frequency of Communication with Friends and Family: More than three times a week    Frequency of Social Gatherings with Friends and Family: Once a week    Attends Religious Services: Never    Database administrator or Organizations: Yes    Attends Engineer, structural: More than 4 times per year    Marital Status: Married   Family History  Problem Relation Age of Onset   Vitamin D deficiency Mother    Cancer - Lung Maternal Grandmother    Cancer Maternal Grandmother        Breast   Leukemia Maternal Grandfather    Cancer Maternal Grandfather    Hyperlipidemia Maternal Grandfather    Lung cancer Paternal Grandmother    Cancer Paternal Grandmother    Allergies  Allergen Reactions    Amoxicillin Rash   Current Outpatient Medications  Medication Sig Dispense Refill   SUMAtriptan (IMITREX) 50 MG tablet Take 1 tablet (50 mg total) by mouth every 2 (two) hours as needed for migraine. May repeat in 2 hours if headache persists or recurs. 10 tablet 0   No current facility-administered medications for this visit.   No results found.  Review of Systems:   A ROS was performed including pertinent positives and negatives as documented in the HPI.  Physical Exam :   Constitutional: NAD and appears stated age Neurological: Alert and oriented Psych: Appropriate affect and cooperative There were no vitals taken for this visit.   Comprehensive Musculoskeletal Exam:    Tenderness to palpation about the glenohumeral joint.  Positive apprehension with a positive jerk maneuver.  2+ posterior load shift 1+ anterior load shift.  Previous his incisions are healed.  Distal neurosensory exam is intact   Imaging:   Xray (3 views right shoulder): Status post previous labral repair with anchor without glenohumeral arthritis    I personally reviewed and interpreted the radiographs.   Assessment and Plan:   38 y.o. male with right shoulder pain consistent with possible glenohumeral labral recurrent tear as he is having many of his previous symptoms.  At this time I have recommended that we perform additional MRI to see if there is any type of labral tearing.  I will plan to see him back following discuss results I did give him a guided series of home exercises to work on as well.   I personally saw and evaluated the patient, and participated in the management and treatment plan.  Huel Cote, MD Attending Physician, Orthopedic Surgery  This document was dictated using Dragon voice recognition software. A reasonable attempt at proof reading has been made to minimize errors.

## 2023-06-10 ENCOUNTER — Other Ambulatory Visit: Payer: Self-pay | Admitting: Neurology

## 2023-06-10 DIAGNOSIS — R0683 Snoring: Secondary | ICD-10-CM

## 2023-06-10 DIAGNOSIS — G4730 Sleep apnea, unspecified: Secondary | ICD-10-CM

## 2023-06-10 DIAGNOSIS — R5383 Other fatigue: Secondary | ICD-10-CM

## 2023-06-10 DIAGNOSIS — G4733 Obstructive sleep apnea (adult) (pediatric): Secondary | ICD-10-CM

## 2023-06-10 DIAGNOSIS — G478 Other sleep disorders: Secondary | ICD-10-CM

## 2023-06-10 NOTE — Telephone Encounter (Signed)
-----   Message from [place] Dohmeier sent at 06/06/2023 12:34 PM EST -----  This HST confirms the presence of severe and supposedly all obstructive sleep apnea.  There is also a clinically significant nadir and low oxygen at 77% saturation but the total time in oxygen desaturation was not very long. There were many periods of bradycardia seen intermittent with tachycardia- the patient's heart rate is seen here, but the device cannot differentiate cardiac rhythms.  These may account for his palpitations .   I am concerned that there could be central apnea present which may not be detected by this home sleep test device.   POD 3: My next step will be to write a script for an auto -titration CPAP device for this patient, which may for him be difficult to afford.  I have specified the settings, but I will hold off until the patient confirms he will agree to treatment.  RECOMMENDATION: Autotitration CPAP device with a setting between 6 and 18 cm water pressure with 3 cm EPR, heated humidification and interface to be fitted.  Please advise the DME of the insurance status.

## 2023-09-19 ENCOUNTER — Ambulatory Visit (INDEPENDENT_AMBULATORY_CARE_PROVIDER_SITE_OTHER): Payer: Self-pay | Admitting: Family Medicine

## 2023-09-19 ENCOUNTER — Encounter: Payer: Self-pay | Admitting: Family Medicine

## 2023-09-19 VITALS — BP 153/91 | HR 68 | Temp 97.7°F | Ht 74.0 in | Wt 217.8 lb

## 2023-09-19 DIAGNOSIS — Z13220 Encounter for screening for lipoid disorders: Secondary | ICD-10-CM

## 2023-09-19 DIAGNOSIS — G4733 Obstructive sleep apnea (adult) (pediatric): Secondary | ICD-10-CM

## 2023-09-19 DIAGNOSIS — R102 Pelvic and perineal pain: Secondary | ICD-10-CM

## 2023-09-19 DIAGNOSIS — R002 Palpitations: Secondary | ICD-10-CM

## 2023-09-19 DIAGNOSIS — Z131 Encounter for screening for diabetes mellitus: Secondary | ICD-10-CM

## 2023-09-19 DIAGNOSIS — G43809 Other migraine, not intractable, without status migrainosus: Secondary | ICD-10-CM

## 2023-09-19 DIAGNOSIS — R030 Elevated blood-pressure reading, without diagnosis of hypertension: Secondary | ICD-10-CM

## 2023-09-19 DIAGNOSIS — Z0001 Encounter for general adult medical examination with abnormal findings: Secondary | ICD-10-CM

## 2023-09-19 LAB — URINALYSIS, ROUTINE W REFLEX MICROSCOPIC
Bilirubin Urine: NEGATIVE
Hgb urine dipstick: NEGATIVE
Ketones, ur: NEGATIVE
Leukocytes,Ua: NEGATIVE
Nitrite: NEGATIVE
RBC / HPF: NONE SEEN (ref 0–?)
Specific Gravity, Urine: <=1.005 — AB (ref 1.000–1.030)
Total Protein, Urine: NEGATIVE
Urine Glucose: NEGATIVE
Urobilinogen, UA: 0.2 (ref 0.0–1.0)
WBC, UA: NONE SEEN (ref 0–?)
pH: 6 (ref 5.0–8.0)

## 2023-09-19 LAB — COMPREHENSIVE METABOLIC PANEL WITH GFR
ALT: 27 U/L (ref 0–53)
AST: 21 U/L (ref 0–37)
Albumin: 4.9 g/dL (ref 3.5–5.2)
Alkaline Phosphatase: 95 U/L (ref 39–117)
BUN: 8 mg/dL (ref 6–23)
CO2: 28 meq/L (ref 19–32)
Calcium: 9.8 mg/dL (ref 8.4–10.5)
Chloride: 102 meq/L (ref 96–112)
Creatinine, Ser: 0.94 mg/dL (ref 0.40–1.50)
GFR: 104.56 mL/min (ref >60.00)
Glucose, Bld: 87 mg/dL (ref 70–99)
Potassium: 4.1 meq/L (ref 3.5–5.1)
Sodium: 138 meq/L (ref 135–145)
Total Bilirubin: 0.5 mg/dL (ref 0.2–1.2)
Total Protein: 7.4 g/dL (ref 6.0–8.3)

## 2023-09-19 LAB — LIPID PANEL
Cholesterol: 212 mg/dL — ABNORMAL HIGH (ref 0–200)
HDL: 39.9 mg/dL (ref >39.00)
LDL Cholesterol: 134 mg/dL — ABNORMAL HIGH (ref 0–99)
NonHDL: 171.87
Total CHOL/HDL Ratio: 5
Triglycerides: 188 mg/dL — ABNORMAL HIGH (ref 0.0–149.0)
VLDL: 37.6 mg/dL (ref 0.0–40.0)

## 2023-09-19 LAB — CBC
HCT: 45.8 % (ref 39.0–52.0)
Hemoglobin: 15.7 g/dL (ref 13.0–17.0)
MCHC: 34.2 g/dL (ref 30.0–36.0)
MCV: 87.6 fl (ref 78.0–100.0)
Platelets: 255 10*3/uL (ref 150.0–400.0)
RBC: 5.23 Mil/uL (ref 4.22–5.81)
RDW: 12.6 % (ref 11.5–15.5)
WBC: 6.3 10*3/uL (ref 4.0–10.5)

## 2023-09-19 LAB — HEMOGLOBIN A1C: Hgb A1c MFr Bld: 5.2 % (ref 4.6–6.5)

## 2023-09-19 NOTE — Assessment & Plan Note (Signed)
 This has been going on for a year or 2.  He has had workup in the past including labs and EKG which was negative.  We did discuss obtaining a Holter monitor a year ago however he declined he is agreeable to pursue this at this point.  Will place order today.  Also did discuss referral to cardiology however he deferred for today.

## 2023-09-19 NOTE — Assessment & Plan Note (Signed)
 Doing much better on the Imitrex .

## 2023-09-19 NOTE — Progress Notes (Signed)
 Chief Complaint:  Harkirat Orozco is a 38 y.o. male who presents today for his annual comprehensive physical exam.    Assessment/Plan:  New/Acute Problems: Shin Pain  No red flags.  May have shinsplints.  Also consider stress reaction however has no recent history of repetitive use.  We discussed home exercise program and handout was given.  Also discussed referral to PT sports medicine however he declined for now.  He will let us  know if not improving  Perineal pain/urinary frequency Unclear etiology as this typically only happens intermittently after intercourse but not every time after intercourse.  May be having transient prostatitis.  Will check PSA, UA, and urine culture.  If this is negative and symptoms persist would consider referral to urology  Chronic Problems Addressed Today: Elevated blood pressure reading Elevated today Home readings are better controlled.  He would like to avoid medications if possible.  He will monitor at home for a few weeks and follow-up with us  via MyChart we did discuss lifestyle modifications including low-sodium diet.  If elevated readings persist will start low-dose amlodipine 5 mg daily.  OSA on CPAP He is doing much better on CPAP.  Palpitation This has been going on for a year or 2.  He has had workup in the past including labs and EKG which was negative.  We did discuss obtaining a Holter monitor a year ago however he declined he is agreeable to pursue this at this point.  Will place order today.  Also did discuss referral to cardiology however he deferred for today.  Migraine Doing much better on the Imitrex .  Preventative Healthcare: Check labs. UpToDate on vaccines.   Patient Counseling(The following topics were reviewed and/or handout was given):  -Nutrition: Stressed importance of moderation in sodium/caffeine intake, saturated fat and cholesterol, caloric balance, sufficient intake of fresh fruits, vegetables, and fiber.   -Stressed the importance of regular exercise.   -Substance Abuse: Discussed cessation/primary prevention of tobacco, alcohol, or other drug use; driving or other dangerous activities under the influence; availability of treatment for abuse.   -Injury prevention: Discussed safety belts, safety helmets, smoke detector, smoking near bedding or upholstery.   -Sexuality: Discussed sexually transmitted diseases, partner selection, use of condoms, avoidance of unintended pregnancy and contraceptive alternatives.   -Dental health: Discussed importance of regular tooth brushing, flossing, and dental visits.  -Health maintenance and immunizations reviewed. Please refer to Health maintenance section.  Return to care in 1 year for next preventative visit.     Subjective:  HPI:  See A/P for status of chronic conditions.  Patient is here today for annual physical.  I last saw him about 9 months ago.  Since our last visit we referred him from sleep study and he was diagnosed with OSA.  Restarted on CPAP about 3 months ago.  He has done well with this.  He is still having intermittent palpitations.  Has been monitoring his blood pressure at home and is typically in the 130s to 140s over 80s to 90s.  He does have likely CPAP is working well and thus would like his sleep is more restorative.  He also has a few additional concerns that he would like to discuss today.  He is having ongoing bilateral shin pain.  This only happens after certain activities.  Usually improves with rest.  Is been well for a few months.  No specific treatments tried.  No injuries.  He does a moderate amount of activity however denies any running  or jogging or long distance hiking.  He has also had intermittent issues with perineal pain after intercourse.  Does not have any pain anytime else.  This does not happen every time with intercourse.  No testicular pain.  No penile discharge.  No dysuria.  He is having more urinary frequency as  well.  Lifestyle Diet: Balanced. Trying to get plenty of vegetables.  Exercise: Going to gym 4 times per week.      09/19/2023   11:10 AM  Depression screen PHQ 2/9  Decreased Interest 0  Down, Depressed, Hopeless 0  PHQ - 2 Score 0    There are no preventive care reminders to display for this patient.   ROS: Per HPI, otherwise a complete review of systems was negative.   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   Patient Active Problem List   Diagnosis Date Noted   Palpitation 09/19/2023   OSA on CPAP 12/23/2022   Elevated blood pressure reading 12/23/2022   Left shoulder pain 01/18/2021   Left foot pain 05/29/2018   Migraine    GERD (gastroesophageal reflux disease)    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   Past Surgical History:  Procedure Laterality Date   BACK SURGERY  2008   CHEST SURGERY  2008   moved muscle   SHOULDER SURGERY Right 2009   LABRAL TEAR   SHOULDER SURGERY Left 2008   TOE SURGERY Left 2010   4th toe, removed cyst    Family History  Problem Relation Age of Onset   Vitamin D deficiency Mother    Cancer - Lung Maternal Grandmother    Cancer Maternal Grandmother        Breast   Leukemia Maternal Grandfather    Cancer Maternal Grandfather    Hyperlipidemia Maternal Grandfather    Lung cancer Paternal Grandmother    Cancer Paternal  Grandmother     Medications- reviewed and updated Current Outpatient Medications  Medication Sig Dispense Refill   SUMAtriptan  (IMITREX ) 50 MG tablet Take 1 tablet (50 mg total) by mouth every 2 (two) hours as needed for migraine. May repeat in 2 hours if headache persists or recurs. 10 tablet 0   No current facility-administered medications for this visit.    Allergies-reviewed and updated Allergies  Allergen Reactions   Amoxicillin Rash    Social History   Socioeconomic History   Marital status: Married    Spouse name: Not on file   Number of children: 0   Years of education: Not on file   Highest education level: Some college, no degree  Occupational History   Not on file  Tobacco Use   Smoking status: Never   Smokeless tobacco: Never  Substance and Sexual Activity   Alcohol use: Yes    Alcohol/week: 2.0 - 3.0 standard drinks of alcohol  Types: 2 - 3 Standard drinks or equivalent per week    Comment: 1 if any occasionally drinker   Drug use: No   Sexual activity: Yes  Other Topics Concern   Not on file  Social History Narrative   Patient is married, with no children.   Patient is right handed.   Patient has a high school graduate, and has some college education.   Patient drinks 1 cup of coffee daily.   Social Drivers of Health   Financial Resource Strain: Medium Risk (09/15/2023)   Overall Financial Resource Strain (CARDIA)    Difficulty of Paying Living Expenses: Somewhat hard  Food Insecurity: No Food Insecurity (09/15/2023)   Hunger Vital Sign    Worried About Running Out of Food in the Last Year: Never true    Ran Out of Food in the Last Year: Never true  Transportation Needs: No Transportation Needs (09/15/2023)   PRAPARE - Administrator, Civil Service (Medical): No    Lack of Transportation (Non-Medical): No  Physical Activity: Sufficiently Active (09/15/2023)   Exercise Vital Sign    Days of Exercise per Week: 5 days    Minutes of Exercise  per Session: 60 min  Stress: No Stress Concern Present (09/15/2023)   [employer] of Occupational Health - Occupational Stress Questionnaire    Feeling of Stress : Only a little  Social Connections: Moderately Integrated (09/15/2023)   Social Connection and Isolation Panel [NHANES]    Frequency of Communication with Friends and Family: Three times a week    Frequency of Social Gatherings with Friends and Family: Once a week    Attends Religious Services: Never    Database administrator or Organizations: Yes    Attends Engineer, structural: More than 4 times per year    Marital Status: Married        Objective:  Physical Exam: BP (!) 153/91   Pulse 68   Temp 97.7 F (36.5 C) (Temporal)   Ht 6\' 2"  (1.88 m)   Wt 217 lb 12.8 oz (98.8 kg)   SpO2 100%   BMI 27.96 kg/m   Body mass index is 27.96 kg/m. Wt Readings from Last 3 Encounters:  09/19/23 217 lb 12.8 oz (98.8 kg)  04/24/23 215 lb (97.5 kg)  12/23/22 213 lb (96.6 kg)   Gen: NAD, resting comfortably HEENT: TMs normal bilaterally. OP clear. No thyromegaly noted.  CV: RRR with no murmurs appreciated Pulm: NWOB, CTAB with no crackles, wheezes, or rhonchi GI: Normal bowel sounds present. Soft, Nontender, Nondistended. MSK: no edema, cyanosis, or clubbing noted.  Bilateral legs without abnormality.  Nontender to palpation.  Neuro vas intact distally. Skin: warm, dry Neuro: CN2-12 grossly intact. Strength 5/5 in upper and lower extremities. Reflexes symmetric and intact bilaterally.  Psych: Normal affect and thought content     Mayda Shippee M. Daneil Dunker, MD 09/19/2023 11:45 AM

## 2023-09-19 NOTE — Patient Instructions (Addendum)
 It was very nice to see you today!  Please monitor your blood pressure at home and follow up with us  in a few weeks.   Work on exercises for your legs.  Will check blood work and a urine sample today.  I will also refer you for a heart monitor.  Please continue to work on diet and exercise.  Return in about 1 year (around 09/18/2024) for Annual Physical.   Take care, Dr Daneil Dunker  PLEASE NOTE:  If you had any lab tests, please let us  know if you have not heard back within a few days. You may see your results on mychart before we have a chance to review them but we will give you a call once they are reviewed by us .   If we ordered any referrals today, please let us  know if you have not heard from their office within the next week.   If you had any urgent prescriptions sent in today, please check with the pharmacy within an hour of our visit to make sure the prescription was transmitted appropriately.   Please try these tips to maintain a healthy lifestyle:  Eat at least 3 REAL meals and 1-2 snacks per day.  Aim for no more than 5 hours between eating.  If you eat breakfast, please do so within one hour of getting up.   Each meal should contain half fruits/vegetables, one quarter protein, and one quarter carbs (no bigger than a computer mouse)  Cut down on sweet beverages. This includes juice, soda, and sweet tea.   Drink at least 1 glass of water with each meal and aim for at least 8 glasses per day  Exercise at least 150 minutes every week.     Preventive Care Brandon Maldonado, Male Preventive care refers to lifestyle choices and visits with your health care provider that can promote health and wellness. Preventive care visits are also called wellness exams. What can I expect for my preventive care visit? Counseling During your preventive care visit, your health care provider may ask about your: Medical history, including: Past medical problems. Family medical history. Current  health, including: Emotional well-being. Home life and relationship well-being. Sexual activity. Lifestyle, including: Alcohol, nicotine or tobacco, and drug use. Access to firearms. Diet, exercise, and sleep habits. Safety issues such as seatbelt and bike helmet use. Sunscreen use. Work and work Astronomer. Physical exam Your health care provider may check your: Height and weight. These may be used to calculate your BMI (body mass index). BMI is a measurement that tells if you are at a healthy weight. Waist circumference. This measures the distance around your waistline. This measurement also tells if you are at a healthy weight and may help predict your risk of certain diseases, such as type 2 diabetes and high blood pressure. Heart rate and blood pressure. Body temperature. Skin for abnormal spots. What immunizations do I need?  Vaccines are usually given at various ages, according to a schedule. Your health care provider will recommend vaccines for you based on your age, medical history, and lifestyle or other factors, such as travel or where you work. What tests do I need? Screening Your health care provider may recommend screening tests for certain conditions. This may include: Lipid and cholesterol levels. Diabetes screening. This is done by checking your blood sugar (glucose) after you have not eaten for a while (fasting). Hepatitis B test. Hepatitis C test. HIV (human immunodeficiency virus) test. STI (sexually transmitted infection) testing, if you  are at risk. Talk with your health care provider about your test results, treatment options, and if necessary, the need for more tests. Follow these instructions at home: Eating and drinking  Eat a healthy diet that includes fresh fruits and vegetables, whole grains, lean protein, and low-fat dairy products. Drink enough fluid to keep your urine pale yellow. Take vitamin and mineral supplements as recommended by your health  care provider. Do not drink alcohol if your health care provider tells you not to drink. If you drink alcohol: Limit how much you have to 0-2 drinks a day. Know how much alcohol is in your drink. In the [place] one drink equals one 12 oz bottle of beer (355 mL), one 5 oz glass of wine (148 mL), or one 1 oz glass of hard liquor (44 mL). Lifestyle Brush your teeth every morning and night with fluoride toothpaste. Floss one time each day. Exercise for at least 30 minutes 5 or more days each week. Do not use any products that contain nicotine or tobacco. These products include cigarettes, chewing tobacco, and vaping devices, such as e-cigarettes. If you need help quitting, ask your health care provider. Do not use drugs. If you are sexually active, practice safe sex. Use a condom or other form of protection to prevent STIs. Find healthy ways to manage stress, such as: Meditation, yoga, or listening to music. Journaling. Talking to a trusted person. Spending time with friends and family. Minimize exposure to UV radiation to reduce your risk of skin cancer. Safety Always wear your seat belt while driving or riding in a vehicle. Do not drive: If you have been drinking alcohol. Do not ride with someone who has been drinking. If you have been using any mind-altering substances or drugs. While texting. When you are tired or distracted. Wear a helmet and other protective equipment during sports activities. If you have firearms in your house, make sure you follow all gun safety procedures. Seek help if you have been physically or sexually abused. What's next? Go to your health care provider once a year for an annual wellness visit. Ask your health care provider how often you should have your eyes and teeth checked. Stay up to date on all vaccines. This information is not intended to replace advice given to you by your health care provider. Make sure you discuss any questions you have with your  health care provider. Document Revised: 09/27/2020 Document Reviewed: 09/27/2020 Elsevier Patient Education  2024 [employer].

## 2023-09-19 NOTE — Assessment & Plan Note (Signed)
 He is doing much better on CPAP.

## 2023-09-19 NOTE — Assessment & Plan Note (Addendum)
 Elevated today Home readings are better controlled.  He would like to avoid medications if possible.  He will monitor at home for a few weeks and follow-up with us  via MyChart we did discuss lifestyle modifications including low-sodium diet.  If elevated readings persist will start low-dose amlodipine 5 mg daily.

## 2023-09-20 LAB — URINE CULTURE
MICRO NUMBER:: 16549131
Result:: NO GROWTH
SPECIMEN QUALITY:: ADEQUATE

## 2023-09-22 ENCOUNTER — Ambulatory Visit: Payer: Self-pay | Attending: Family Medicine

## 2023-09-22 DIAGNOSIS — R002 Palpitations: Secondary | ICD-10-CM

## 2023-09-22 NOTE — Progress Notes (Unsigned)
 Enrolled for [employer] to mail a ZIO XT long term holter monitor to the patients address on file.   EP to read

## 2023-09-25 ENCOUNTER — Ambulatory Visit: Payer: Self-pay | Admitting: Family Medicine

## 2023-09-25 LAB — TSH: TSH: 0.68 u[IU]/mL (ref 0.35–5.50)

## 2023-09-25 LAB — PSA: PSA: 0.08 ng/mL — ABNORMAL LOW (ref 0.10–4.00)

## 2023-09-25 NOTE — Progress Notes (Signed)
 Cholesterol is borderline elevated but everything else is stable. Do not need to make any changes to his treatment plan at this time. He should keep working on diet and exercise. We will contact him once we get results back on his heart monitor. [name]

## 2023-10-08 DIAGNOSIS — R002 Palpitations: Secondary | ICD-10-CM

## 2023-10-09 ENCOUNTER — Other Ambulatory Visit: Payer: Self-pay | Admitting: *Deleted

## 2023-10-09 DIAGNOSIS — I498 Other specified cardiac arrhythmias: Secondary | ICD-10-CM

## 2023-10-09 NOTE — Progress Notes (Signed)
 His  heart monitor showed that he had a couple of small arrhythmias and a few extra beats.  These are all typically benign and should not cause him any issue however it would be a good idea for him to see a cardiologist for further evaluation.  Please place referral if he is agreeable.

## 2023-10-09 NOTE — Telephone Encounter (Signed)
 Referral placed.

## 2023-11-21 ENCOUNTER — Encounter: Payer: Self-pay | Admitting: Family Medicine

## 2024-02-16 ENCOUNTER — Encounter: Payer: Self-pay | Admitting: Radiology

## 2024-04-02 ENCOUNTER — Ambulatory Visit: Payer: Self-pay | Admitting: Family Medicine

## 2024-04-02 ENCOUNTER — Ambulatory Visit (INDEPENDENT_AMBULATORY_CARE_PROVIDER_SITE_OTHER): Payer: Self-pay | Admitting: Family Medicine

## 2024-04-02 ENCOUNTER — Ambulatory Visit: Payer: Self-pay

## 2024-04-02 VITALS — BP 118/80 | HR 60 | Temp 97.8°F | Ht 74.0 in | Wt 218.0 lb

## 2024-04-02 DIAGNOSIS — R2689 Other abnormalities of gait and mobility: Secondary | ICD-10-CM

## 2024-04-02 DIAGNOSIS — S99921A Unspecified injury of right foot, initial encounter: Secondary | ICD-10-CM

## 2024-04-02 NOTE — Patient Instructions (Signed)
 You can take 2 Aleve  every 12 hours with food. You can take Tylenol 1,000 mg every 12 hours if needed.   Ice and elevate.   RICE Therapy for Routine Care of Injuries Many injuries can be cared for with rest, ice, compression, and elevation. This is also called RICE therapy. RICE therapy includes: Resting the injured body part. Putting ice on the injury. Putting pressure on the injury. This is also called compression. Raising the injured part. This is also called elevation. RICE therapy can help reduce pain and swelling. Supplies needed: Ice. Plastic bag. Towel. Elastic bandage. Pillow or pillows to raise the injured body part. How to care for your injury with RICE therapy Rest Try to rest the injured part of your body. You can go back to your normal activities when your health care provider says it's okay to do them and when you can do them without pain. Ask what things are safe for you to do. Some injuries heal better with early movement instead of resting. If you rest the injury too much, it may not heal as well. Ask your provider if you should do exercises to help your injury get better. Ice Putting ice on your injury can help to lessen swelling and pain. Do not apply ice directly to your skin. Use ice on as many days as told by your provider. If told, put ice on the area. Put ice in a plastic bag. Place a towel between your skin and the bag. Leave the ice on for 20 minutes, 2-3 times a day. If your skin turns bright red, take off the ice right away to prevent skin damage. The risk of damage is higher if you can't feel pain, heat, or cold.  Compression Put pressure, also called compression, on your injured area. This can be done with an elastic bandage. If this type of bandage has been put on your injury: Follow instructions on the package the bandage came in about how to use it. Do not wrap the bandage too tightly. Wrap the bandage more loosely if part of your body beyond the  bandage looks blue, or is swollen, cold, painful, or loses feeling. Take off the bandage and put it on again every 3-4 hours or as told by your provider. Call your provider if the bandage seems to make your injury worse.  Elevation Raise the injured area above the level of your heart while you're sitting or lying down. Use a pillow to support your injured area as needed. Follow these instructions at home: If your symptoms get worse or last a long time, make a follow-up appointment with your provider. You may need to have imaging tests, such as X-rays or an MRI. If you have imaging tests, ask how to get your results when they are ready. Contact a health care provider if: You keep having pain and swelling. Your symptoms get worse. Get help right away if: You have sudden, very bad pain at your injury or lower than your injury. You have tingling or numbness at your injury or lower than your injury, and it does not go away when you take the bandage off. This information is not intended to replace advice given to you by your health care provider. Make sure you discuss any questions you have with your health care provider. Document Revised: 01/02/2023 Document Reviewed: 06/17/2022 Elsevier Patient Education  2024 [employer].

## 2024-04-02 NOTE — Progress Notes (Signed)
 Called patient and advised him of results. Recommended he go to [facility] after hours urgent care due to fracture.

## 2024-04-02 NOTE — Progress Notes (Signed)
 "  Subjective:     Patient ID: Brandon Maldonado, male    DOB: 1985-08-30, 38 y.o.   MRN: [id number]  Chief Complaint  Patient presents with   Foot Injury    Works in holiday representative, had plywood fall on right foot. Anything touching from above is very painful, can walk on it. Toes are purple    Foot Injury     Discussed the use of AI scribe software for clinical note transcription with the patient, who gave verbal consent to proceed.  History of Present Illness Brandon Maldonado is a 38 year old male who presents with an acute right foot injury.  Right foot trauma and pain - Sustained injury two days ago when a 60 to 70-pound piece of subflooring fell directly onto the dorsum of the right foot at work - Initially able to bear weight, but subsequently developed severe pain and significant bruising over the dorsum of the right foot - Pain is excruciating with touch - Ambulates on the lateral aspect of the foot for comfort, resulting in strain to the posterior foot - No ankle pain - No numbness, but experiences intermittent shock-like pains in the right foot  Prior ankle injuries - History of multiple prior ankle injuries without current symptoms  Pain management and functional impact - Took Aleve  twice with good relief - Skipped pain medication yesterday, then took Aleve  again at lunchtime today for increased discomfort - Resting and staying off the foot yesterday increased discomfort - 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     Health Maintenance Due  Topic Date Due   Hepatitis B Vaccines 19-59 Average Risk (1 of 3 - 19+ 3-dose series) Never done   HPV VACCINES (1 - 3-dose SCDM series) Never done   Influenza Vaccine  11/14/2023   COVID-19 Vaccine (3 - 2025-26 season) 12/15/2023    Past Medical History:  Diagnosis Date   Elevated blood pressure reading 12/23/2022   Esophageal reflux    GERD (gastroesophageal reflux disease)    Left foot pain  05/29/2018   L great toe XR - 03/27/18     L foot MRI - 05/04/18     Left shoulder pain 01/18/2021   Migraine    Migraine    Other bursitis disorders    [place]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   Sleep-disordered breathing 12/23/2022   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    Past Surgical History:  Procedure Laterality Date   BACK SURGERY  2008   CHEST SURGERY  2008   moved muscle   SHOULDER SURGERY Right 2009   LABRAL TEAR   SHOULDER SURGERY Left 2008   TOE SURGERY Left 2010   4th toe, removed cyst    Family History  Problem Relation Age of Onset   Vitamin D deficiency Mother    Cancer - Lung Maternal Grandmother    Cancer Maternal Grandmother        Breast   Leukemia Maternal Grandfather    Cancer Maternal Grandfather    Hyperlipidemia Maternal Grandfather    Lung cancer Paternal Grandmother    Cancer Paternal Grandmother     Social History   Socioeconomic History   Marital status: Married    Spouse name: Not on file   Number of children: 0   Years of education: Not on file   Highest education level: Some college, no degree  Occupational  History   Not on file  Tobacco Use   Smoking status: Never   Smokeless tobacco: Never  Substance and Sexual Activity   Alcohol use: Yes    Alcohol/week: 2.0 - 3.0 standard drinks of alcohol    Types: 2 - 3 Standard drinks or equivalent per week    Comment: 1 if any occasionally drinker   Drug use: No   Sexual activity: Yes  Other Topics Concern   Not on file  Social History Narrative   Patient is married, with no children.   Patient is right handed.   Patient has a high school graduate, and has some college education.   Patient drinks 1 cup of coffee daily.   Social Drivers of Health   Tobacco Use: Low Risk (04/02/2024)   Patient History    Smoking Tobacco Use: Never    Smokeless Tobacco Use: Never    Passive Exposure: Not  on file  Financial Resource Strain: Medium Risk (04/02/2024)   Overall Financial Resource Strain (CARDIA)    Difficulty of Paying Living Expenses: Somewhat hard  Food Insecurity: No Food Insecurity (04/02/2024)   Epic    Worried About Programme Researcher, Broadcasting/film/video in the Last Year: Never true    Ran Out of Food in the Last Year: Never true  Transportation Needs: Unmet Transportation Needs (04/02/2024)   Epic    Lack of Transportation (Medical): No    Lack of Transportation (Non-Medical): Yes  Physical Activity: Sufficiently Active (04/02/2024)   Exercise Vital Sign    Days of Exercise per Week: 3 days    Minutes of Exercise per Session: 60 min  Stress: Stress Concern Present (04/02/2024)   [employer] of Occupational Health - Occupational Stress Questionnaire    Feeling of Stress: To some extent  Social Connections: Moderately Integrated (04/02/2024)   Social Connection and Isolation Panel    Frequency of Communication with Friends and Family: Three times a week    Frequency of Social Gatherings with Friends and Family: Once a week    Attends Religious Services: Never    Database Administrator or Organizations: Yes    Attends Engineer, Structural: More than 4 times per year    Marital Status: Married  Catering Manager Violence: Not on file  Depression (PHQ2-9): Low Risk (09/19/2023)   Depression (PHQ2-9)    PHQ-2 Score: 0  Alcohol Screen: Low Risk (04/02/2024)   Alcohol Screen    Last Alcohol Screening Score (AUDIT): 1  Housing: Low Risk (04/02/2024)   Epic    Unable to Pay for Housing in the Last Year: No    Number of Times Moved in the Last Year: 0    Homeless in the Last Year: No  Utilities: Not on file  Health Literacy: Not on file    Outpatient Medications Prior to Visit  Medication Sig Dispense Refill   SUMAtriptan  (IMITREX ) 50 MG tablet Take 1 tablet (50 mg total) by mouth every 2 (two) hours as needed for migraine. May repeat in 2 hours if headache persists or  recurs. 10 tablet 0   No facility-administered medications prior to visit.    Allergies[1]  Review of Systems  Constitutional:  Negative for chills and fever.  Cardiovascular:  Negative for chest pain and leg swelling.  Gastrointestinal:  Negative for nausea and vomiting.  Musculoskeletal:  Positive for joint pain.  Neurological:  Negative for dizziness, focal weakness and headaches.       Objective:    Physical Exam  Constitutional:      General: He is not in acute distress.    Appearance: He is not ill-appearing.  Eyes:     Extraocular Movements: Extraocular movements intact.     Conjunctiva/sclera: Conjunctivae normal.  Cardiovascular:     Rate and Rhythm: Normal rate.  Pulmonary:     Effort: Pulmonary effort is normal.  Musculoskeletal:     Cervical back: Normal range of motion and neck supple.     Right lower leg: Normal.     Right ankle: Normal.     Right foot: Normal capillary refill. Swelling and bony tenderness present. Normal pulse.     Comments: Bruising and swelling to dorsum of right foot with severe TTP to 1st and 2nd MTP joints and midfoot of right foot Neurovascularly intact right foot and skin intact without sign of infection   Skin:    General: Skin is warm and dry.  Neurological:     General: No focal deficit present.     Mental Status: He is alert and oriented to person, place, and time.     Sensory: No sensory deficit.     Motor: No weakness.     Gait: Gait abnormal.  Psychiatric:        Mood and Affect: Mood normal.        Behavior: Behavior normal.        Thought Content: Thought content normal.      BP 118/80   Pulse 60   Temp 97.8 F (36.6 C) (Temporal)   Ht 6' 2 (1.88 m)   Wt 218 lb (98.9 kg)   SpO2 98%   BMI 27.99 kg/m  Wt Readings from Last 3 Encounters:  04/02/24 218 lb (98.9 kg)  09/19/23 217 lb 12.8 oz (98.8 kg)  04/24/23 215 lb (97.5 kg)       Assessment & Plan:   Problem List Items Addressed This Visit    None Visit Diagnoses       Injury of right foot, initial encounter    -  Primary   Relevant Orders   DG Foot Complete Right (Completed)     Antalgic gait       Relevant Orders   DG Foot Complete Right (Completed)       Assessment and Plan Assessment & Plan Acute right foot injury Sustained two days ago from a 60-70 pound object falling on the top of the right foot. Significant bruising and pain, especially with pressure on the top of the foot. Able to walk on the side of the foot but experiences strain on the back muscle. No numbness reported. Differential diagnosis includes fracture, pending x-ray results. - Ordered stat x-ray of the right foot to assess for fracture. - Advised taking two Aleve  every twelve hours with food for pain management. - Recommended Tylenol 1000 mg every twelve hours as needed for additional pain relief. - Instructed on RICE protocol: rest, ice, compression, and elevation. - If x-ray shows fracture, will refer to orthopedic clinic for further evaluation and management.  FINDINGS: Acute mildly displaced intra-articular fracture base of the first distal phalanx. No subluxation.   IMPRESSION: Acute mildly displaced intra-articular fracture base of first distal phalanx.  Patient called and advised to proceed to [facility] after hours orthopedic urgent care   I am having Brandon Maldonado. Nodine maintain his SUMAtriptan .  No orders of the defined types were placed in this encounter.      [1]  Allergies Allergen Reactions   Amoxicillin Rash   "

## 2024-09-21 ENCOUNTER — Encounter: Payer: Self-pay | Admitting: Family Medicine
